# Patient Record
Sex: Male | Born: 1950 | Race: Black or African American | Hispanic: No | Marital: Married | Smoking: Former smoker
Health system: Southern US, Community
[De-identification: ages and names within clinical notes are randomized; demographics above are authoritative.]

## PROBLEM LIST (undated history)

## (undated) DIAGNOSIS — G5603 Carpal tunnel syndrome, bilateral upper limbs: Secondary | ICD-10-CM

## (undated) DIAGNOSIS — M1990 Unspecified osteoarthritis, unspecified site: Secondary | ICD-10-CM

## (undated) DIAGNOSIS — I10 Essential (primary) hypertension: Secondary | ICD-10-CM

## (undated) HISTORY — PX: CARPAL TUNNEL RELEASE: SHX101

## (undated) HISTORY — PX: COLONOSCOPY: SHX174

## (undated) HISTORY — PX: EYE SURGERY: SHX253

---

## 2003-07-31 ENCOUNTER — Ambulatory Visit (HOSPITAL_COMMUNITY): Admission: RE | Admit: 2003-07-31 | Discharge: 2003-07-31 | Payer: Self-pay | Admitting: *Deleted

## 2006-10-02 ENCOUNTER — Ambulatory Visit (HOSPITAL_BASED_OUTPATIENT_CLINIC_OR_DEPARTMENT_OTHER): Admission: RE | Admit: 2006-10-02 | Discharge: 2006-10-02 | Payer: Self-pay | Admitting: Orthopedic Surgery

## 2007-05-11 ENCOUNTER — Ambulatory Visit: Payer: Self-pay | Admitting: Internal Medicine

## 2007-05-11 DIAGNOSIS — F17200 Nicotine dependence, unspecified, uncomplicated: Secondary | ICD-10-CM

## 2007-05-13 ENCOUNTER — Encounter (INDEPENDENT_AMBULATORY_CARE_PROVIDER_SITE_OTHER): Payer: Self-pay | Admitting: *Deleted

## 2007-05-14 ENCOUNTER — Telehealth (INDEPENDENT_AMBULATORY_CARE_PROVIDER_SITE_OTHER): Payer: Self-pay | Admitting: *Deleted

## 2009-07-06 ENCOUNTER — Ambulatory Visit (HOSPITAL_BASED_OUTPATIENT_CLINIC_OR_DEPARTMENT_OTHER): Admission: RE | Admit: 2009-07-06 | Discharge: 2009-07-06 | Payer: Self-pay | Admitting: Orthopedic Surgery

## 2010-09-24 ENCOUNTER — Inpatient Hospital Stay (INDEPENDENT_AMBULATORY_CARE_PROVIDER_SITE_OTHER)
Admission: RE | Admit: 2010-09-24 | Discharge: 2010-09-24 | Disposition: A | Discharge status: Home / Self Care | Payer: BC Managed Care – PPO | Source: Ambulatory Visit | Attending: Family Medicine | Admitting: Family Medicine

## 2010-09-24 ENCOUNTER — Emergency Department (HOSPITAL_COMMUNITY)
Admission: EM | Admit: 2010-09-24 | Discharge: 2010-09-25 | Disposition: A | Discharge status: Home / Self Care | Payer: BC Managed Care – PPO | Attending: Emergency Medicine | Admitting: Emergency Medicine

## 2010-09-24 ENCOUNTER — Emergency Department (HOSPITAL_COMMUNITY): Payer: BC Managed Care – PPO

## 2010-09-24 DIAGNOSIS — R079 Chest pain, unspecified: Secondary | ICD-10-CM

## 2010-09-24 DIAGNOSIS — R0789 Other chest pain: Secondary | ICD-10-CM | POA: Insufficient documentation

## 2010-09-24 DIAGNOSIS — M940 Chondrocostal junction syndrome [Tietze]: Secondary | ICD-10-CM | POA: Insufficient documentation

## 2010-09-24 LAB — DIFFERENTIAL
Basophils Absolute: 0 10*3/uL (ref 0.0–0.1)
Basophils Relative: 1 % (ref 0–1)
Eosinophils Absolute: 0.3 10*3/uL (ref 0.0–0.7)
Eosinophils Relative: 6 % — ABNORMAL HIGH (ref 0–5)
Lymphocytes Relative: 36 % (ref 12–46)
Lymphs Abs: 1.9 10*3/uL (ref 0.7–4.0)
Monocytes Absolute: 0.7 10*3/uL (ref 0.1–1.0)
Monocytes Relative: 13 % — ABNORMAL HIGH (ref 3–12)
Neutro Abs: 2.3 10*3/uL (ref 1.7–7.7)
Neutrophils Relative %: 44 % (ref 43–77)

## 2010-09-24 LAB — CBC
HCT: 42.5 % (ref 39.0–52.0)
Hemoglobin: 14.3 g/dL (ref 13.0–17.0)
MCH: 30.5 pg (ref 26.0–34.0)
MCHC: 33.6 g/dL (ref 30.0–36.0)
MCV: 90.6 fL (ref 78.0–100.0)
Platelets: 189 10*3/uL (ref 150–400)
RBC: 4.69 MIL/uL (ref 4.22–5.81)
RDW: 14 % (ref 11.5–15.5)
WBC: 5.3 10*3/uL (ref 4.0–10.5)

## 2010-09-24 LAB — BASIC METABOLIC PANEL
Calcium: 9.3 mg/dL (ref 8.4–10.5)
GFR calc Af Amer: >60 mL/min (ref >60)
GFR calc non Af Amer: >60 mL/min (ref >60)
Potassium: 4.9 mEq/L (ref 3.5–5.1)
Sodium: 139 mEq/L (ref 135–145)

## 2010-09-24 LAB — POCT CARDIAC MARKERS
CKMB, poc: <1 ng/mL — ABNORMAL LOW (ref 1.0–8.0)
Myoglobin, poc: 41.2 ng/mL (ref 12–200)
Troponin i, poc: <0.05 ng/mL (ref 0.00–0.09)

## 2011-01-03 NOTE — Op Note (Signed)
NAME:  Eric Massey, Eric Massey NO.:  [id number]   MEDICAL RECORD NO.:  [id number]                   PATIENT TYPE:  AMB   LOCATION:  ENDO                                 FACILITY:  [facility]   PHYSICIAN:  Georgiana Spinner, M.D.                 DATE OF BIRTH:  1951/02/28   DATE OF PROCEDURE:  07/31/2003  DATE OF DISCHARGE:                                 OPERATIVE REPORT   PROCEDURE:  Colonoscopy.   ENDOSCOPIST:  Georgiana Spinner, M.D.   INDICATIONS FOR PROCEDURE:  Rectal bleeding.   ANESTHESIA:  Demerol 50 mg, Versed 8 mg.   DESCRIPTION OF PROCEDURE:  With the patient mildly sedated in the left  lateral decubitus position, a rectal examination was performed, which  revealed hemorrhoids only.  From this point the colonoscope was inserted  into the rectum and passed under direct vision to the cecum, identified by  the ileocecal valve and the appendiceal orifice.  From this point the  colonoscope was slowly withdrawn, taking circumferential views of the  colonic mucosa, stopping only to photograph the rectum in direct and in  retroflexed view.  The endoscope was straightened and withdrawn.  The  patient's vital signs and pulse oximeter remained stable.  The patient tolerated the procedure well without any apparent complications.   FINDINGS:  Internal hemorrhoids, otherwise unremarkable endoscopic  examination.   PLAN:  Have the patient follow up with me as needed.                                               Georgiana Spinner, M.D.    GMO/MEDQ  D:  07/31/2003  T:  07/31/2003  Job:  [id number]

## 2011-01-03 NOTE — Op Note (Signed)
NAME:  Eric Massey, Eric Massey NO.:  [id number]   MEDICAL RECORD NO.:  [id number]          PATIENT TYPE:  AMB   LOCATION:  DSC                          FACILITY:  [facility]   PHYSICIAN:  Katy Fitch. Sypher, M.D. DATE OF BIRTH:  1950/12/24   DATE OF PROCEDURE:  DATE OF DISCHARGE:                               OPERATIVE REPORT   PREOPERATIVE DIAGNOSIS:  Right carpal tunnel syndrome.   POSTOPERATIVE DIAGNOSIS:  Right carpal tunnel syndrome.   OPERATIONS:  Release of right transverse carpal ligament.   OPERATING SURGEON:  Katy Fitch. Sypher, MD   ASSISTANT:  Annye Rusk PA-C.   ANESTHESIA:  Is general by LMA, supervising anesthesiologist is Dr.  Gypsy Balsam.   INDICATIONS:  [name] is a 60 year old gentleman referred  through the courtesy of Dr. Dimas Alexandria for evaluation and  management of bilateral hand numbness.  Clinical examination in Dr.  Pincus Sanes office suggested carpal tunnel syndrome.  Electrodiagnostic  studies accomplished at [facility] confirmed severe  right carpal tunnel syndrome and left carpal tunnel syndrome.   Due to a failure to respond to nonoperative measures, [name] is  brought to the operating room at this time for release of his right  transverse carpal ligament.   PROCEDURE:  [name] is brought to the operating room and placed  in supine position on the operating table.   Following induction of general anesthesia by LMA technique, the right  arm was prepped with Betadine soap and solution and sterilely draped.  A  pneumatic tourniquet was applied to the proximal right brachium.   Following exsanguination of the right arm with an Esmarch bandage, the  arterial tourniquet was inflated to 250 mmHg.  The procedure commenced  with a short incision in the line of the ring finger in the palm.  The  subcutaneous tissues were carefully divided to reveal the palmar fascia.  This was split longitudinally to reveal  the common sensory branch of the  median nerve and superficial palmar arch.   The transverse carpal ligament was isolated by passage of a Penfield #4  elevator superficial to the tendons and nerve.  A pair of scissors was  used to release the transverse carpal ligament along its ulnar border  extending into the distal forearm.   This widely opened the carpal canal.  No masses or other predicaments  were noted.   Bleeding points along the margin of the released ligament were  electrocauterized with bipolar current.   For aftercare [name] is provided a prescription for Percocet 5 mg  one or two tablets p.o. q.4-6h. p.r.n. pain, 20 tablets without refill.   He will return to see me the office for follow-up in 1 week.   Synetta Fail Sypher, M.D.  Electronically Signed     RVS/MEDQ  D:  10/02/2006  T:  10/02/2006  Job:  [id number]

## 2015-07-24 ENCOUNTER — Other Ambulatory Visit: Payer: Self-pay | Admitting: Urology

## 2015-07-24 DIAGNOSIS — R972 Elevated prostate specific antigen [PSA]: Secondary | ICD-10-CM

## 2015-08-09 ENCOUNTER — Ambulatory Visit (HOSPITAL_COMMUNITY)
Admission: RE | Admit: 2015-08-09 | Discharge: 2015-08-09 | Disposition: A | Discharge status: Home / Self Care | Payer: BLUE CROSS/BLUE SHIELD | Source: Ambulatory Visit | Attending: Urology | Admitting: Urology

## 2015-08-09 DIAGNOSIS — R972 Elevated prostate specific antigen [PSA]: Secondary | ICD-10-CM | POA: Insufficient documentation

## 2015-08-09 LAB — POCT I-STAT CREATININE: CREATININE: 0.9 mg/dL (ref 0.61–1.24)

## 2015-08-09 MED ORDER — GADOBENATE DIMEGLUMINE 529 MG/ML IV SOLN
20.0000 mL | Freq: Once | INTRAVENOUS | Status: AC | PRN
  Administered 2015-08-09: 17 mL via INTRAVENOUS

## 2016-09-08 DIAGNOSIS — G5602 Carpal tunnel syndrome, left upper limb: Secondary | ICD-10-CM | POA: Diagnosis not present

## 2016-10-17 DIAGNOSIS — Z125 Encounter for screening for malignant neoplasm of prostate: Secondary | ICD-10-CM | POA: Diagnosis not present

## 2016-10-17 DIAGNOSIS — I10 Essential (primary) hypertension: Secondary | ICD-10-CM | POA: Diagnosis not present

## 2016-10-20 DIAGNOSIS — K640 First degree hemorrhoids: Secondary | ICD-10-CM | POA: Diagnosis not present

## 2016-10-20 DIAGNOSIS — I10 Essential (primary) hypertension: Secondary | ICD-10-CM | POA: Diagnosis not present

## 2016-10-20 DIAGNOSIS — Z86010 Personal history of colonic polyps: Secondary | ICD-10-CM | POA: Diagnosis not present

## 2016-10-23 DIAGNOSIS — R972 Elevated prostate specific antigen [PSA]: Secondary | ICD-10-CM | POA: Diagnosis not present

## 2016-10-30 DIAGNOSIS — R972 Elevated prostate specific antigen [PSA]: Secondary | ICD-10-CM | POA: Diagnosis not present

## 2016-11-04 DIAGNOSIS — K6389 Other specified diseases of intestine: Secondary | ICD-10-CM | POA: Diagnosis not present

## 2016-11-04 DIAGNOSIS — Z1211 Encounter for screening for malignant neoplasm of colon: Secondary | ICD-10-CM | POA: Diagnosis not present

## 2017-01-06 DIAGNOSIS — G5602 Carpal tunnel syndrome, left upper limb: Secondary | ICD-10-CM | POA: Diagnosis not present

## 2017-04-27 DIAGNOSIS — R972 Elevated prostate specific antigen [PSA]: Secondary | ICD-10-CM | POA: Diagnosis not present

## 2017-05-04 DIAGNOSIS — N400 Enlarged prostate without lower urinary tract symptoms: Secondary | ICD-10-CM | POA: Diagnosis not present

## 2017-05-04 DIAGNOSIS — R972 Elevated prostate specific antigen [PSA]: Secondary | ICD-10-CM | POA: Diagnosis not present

## 2017-07-25 IMAGING — MR MR PROSTATE WO/W CM
23 of 53 series · 23 of 53 positions shown · IV contrast (multihance)
Comparison: None.

CLINICAL DATA: Elevated PSA. Negative prostate biopsy in July 2014.

EXAM:
MR PROSTATE WITHOUT AND WITH CONTRAST
TECHNIQUE: Multiplanar multisequence MRI images were obtained of the pelvis
centered about the prostate. Pre and post contrast images were
obtained.
CONTRAST:  17mL MULTIHANCE GADOBENATE DIMEGLUMINE 529 MG/ML IV SOLN

[Series 3: bSSFP fat-sat · axial · 6.0mm · 0.86mm/px · 1 of 44 slices shown]
[im 1/44]
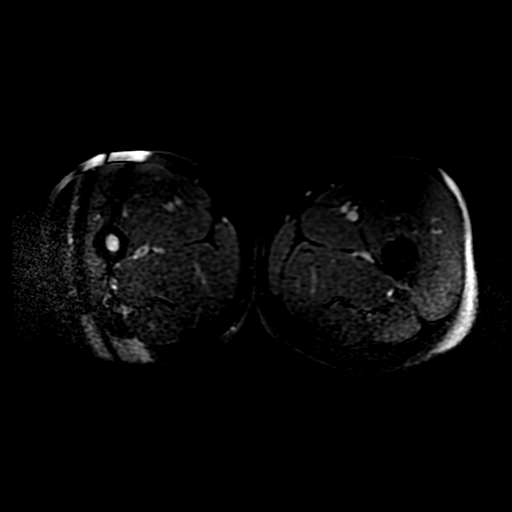

[Series 4: T1 · axial · 6.0mm · 0.86mm/px · 1 of 44 slices shown (1 of 2)]
[im 1/44]
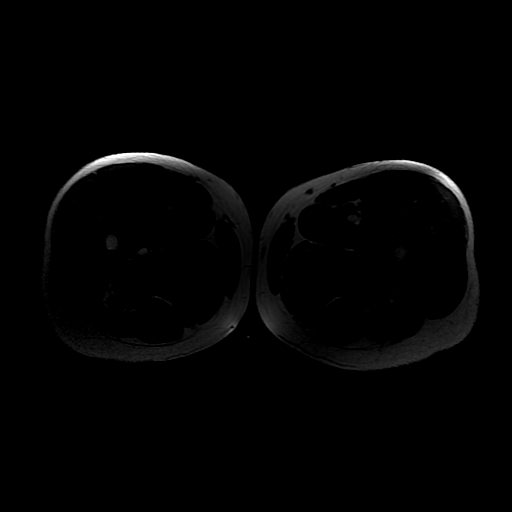

[Series 5: T2 · axial · 3.0mm · 0.29mm/px · 1 of 28 slices shown (1 of 3)]
[im 1/28]
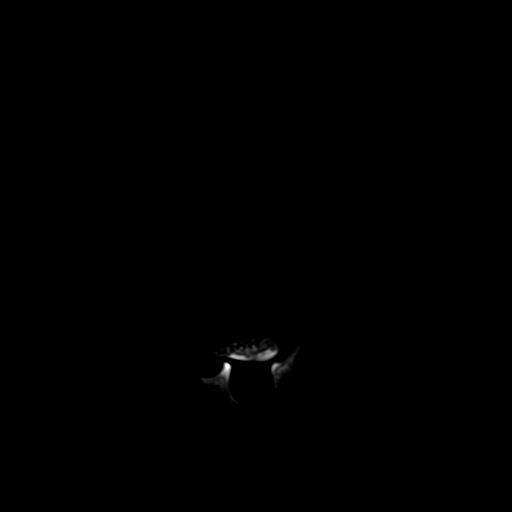

[Series 6: T1 · axial · 3.0mm · 0.29mm/px · 1 of 28 slices shown (2 of 2)]
[im 1/28]
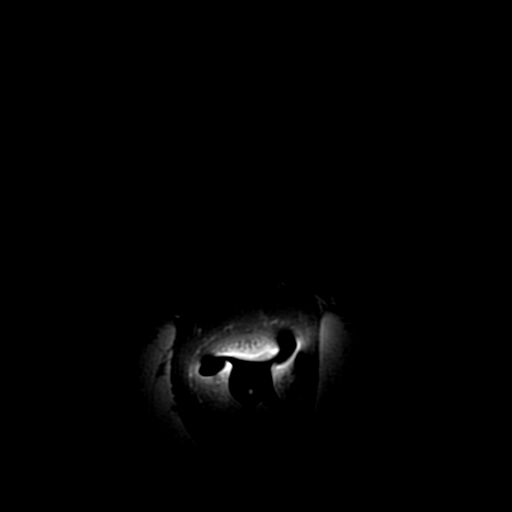

[Series 7: T2 · sagittal · 4.0mm · 0.29mm/px · 1 of 20 slices shown (2 of 3)]
[im 1/20]
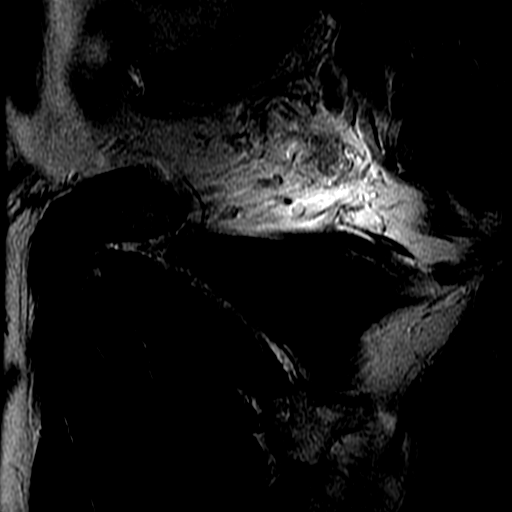

[Series 8: T2 · coronal · 4.0mm · 0.29mm/px · 1 of 20 slices shown (3 of 3)]
[im 1/20]
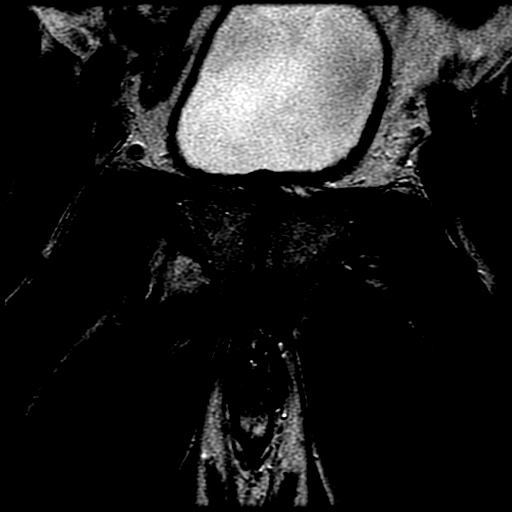

[Series 9: DWI · axial · 3.0mm · 0.59mm/px · 1 of 48 slices shown (1 of 6)]
[im 1/48]
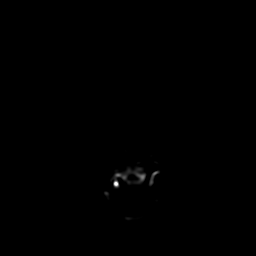

[Series 10: DWI · axial · 3.0mm · 0.59mm/px · 1 of 48 slices shown (2 of 6)]
[im 1/48]
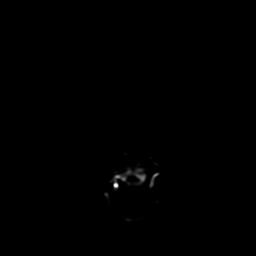

[Series 11: DWI · axial · 3.0mm · 0.59mm/px · 1 of 48 slices shown (3 of 6)]
[im 1/48]
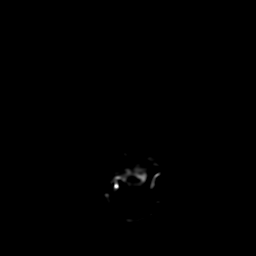

[Series 900: DWI · axial · 3.0mm · 0.59mm/px · 1 of 24 slices shown (4 of 6)]
[im 1/24]
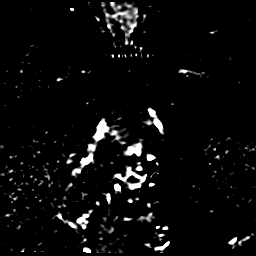

[Series 1000: DWI · axial · 3.0mm · 0.59mm/px · 1 of 24 slices shown (5 of 6)]
[im 1/24]
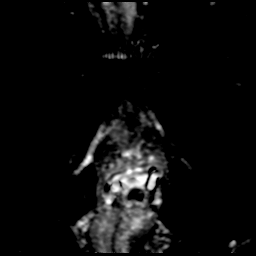

[Series 1100: DWI · axial · 3.0mm · 0.59mm/px · 1 of 24 slices shown (6 of 6)]
[im 1/24]
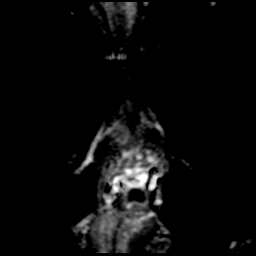

[((id)/(id)/1)-((id)/(id)/1) · axial · 3.0mm · 0.43mm/px · 1 of 71 slices shown (1 of 11)]
[im 1/71]
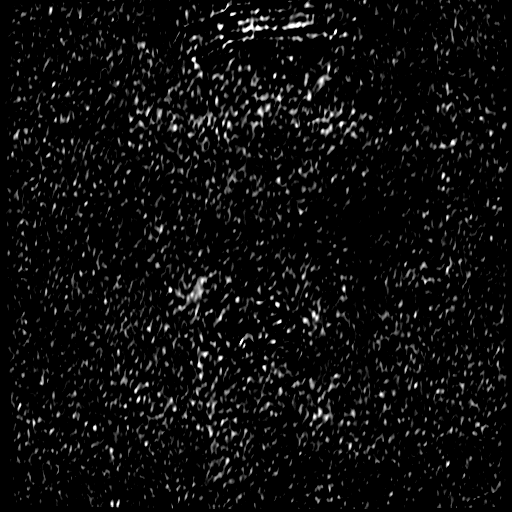

[((id)/(id)/1)-((id)/(id)/1) · axial · 3.0mm · 0.43mm/px · 1 of 72 slices shown (2 of 11)]
[im 1/72]
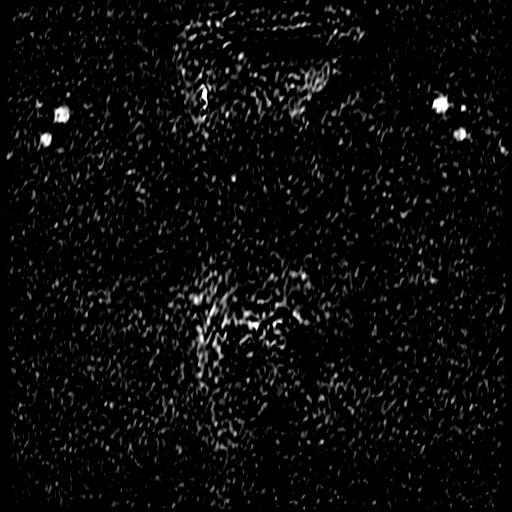

[((id)/(id)/1)-((id)/(id)/1) · axial · 3.0mm · 0.43mm/px · 1 of 72 slices shown (3 of 11)]
[im 1/72]
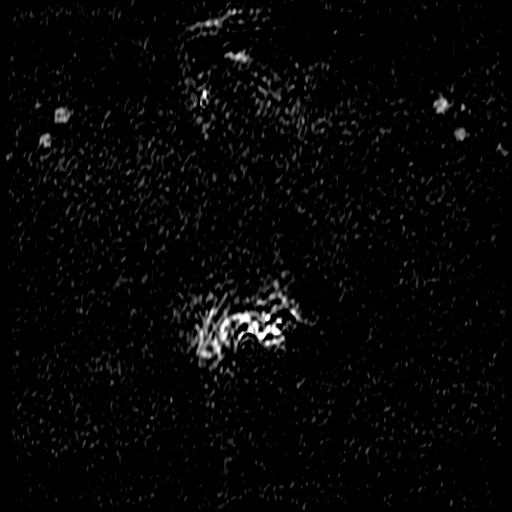

[((id)/(id)/1)-((id)/(id)/1) · axial · 3.0mm · 0.43mm/px · 1 of 72 slices shown (4 of 11)]
[im 1/72]
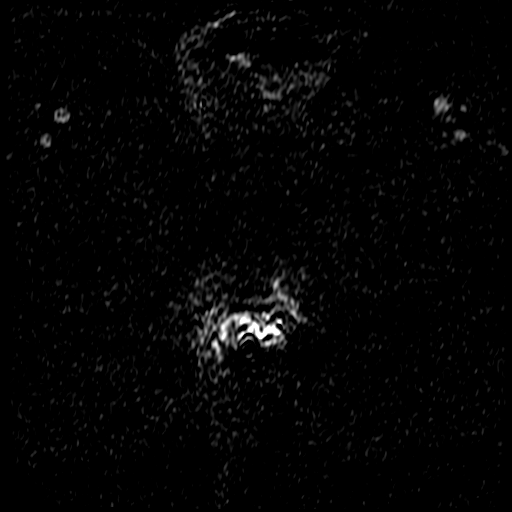

[((id)/(id)/1)-((id)/(id)/1) · axial · 3.0mm · 0.43mm/px · 1 of 72 slices shown (5 of 11)]
[im 1/72]
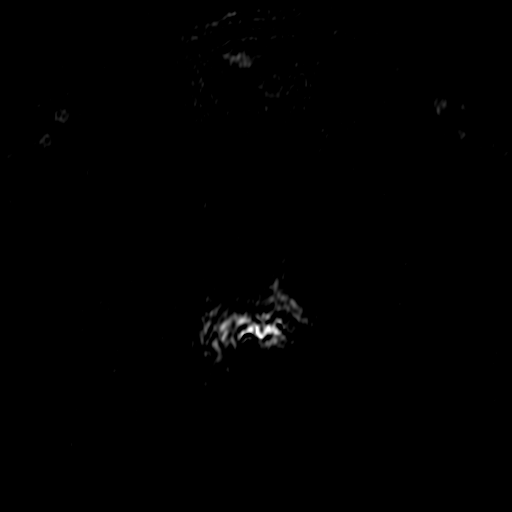

[((id)/(id)/1)-((id)/(id)/1) · axial · 3.0mm · 0.43mm/px · 1 of 72 slices shown (6 of 11)]
[im 1/72]
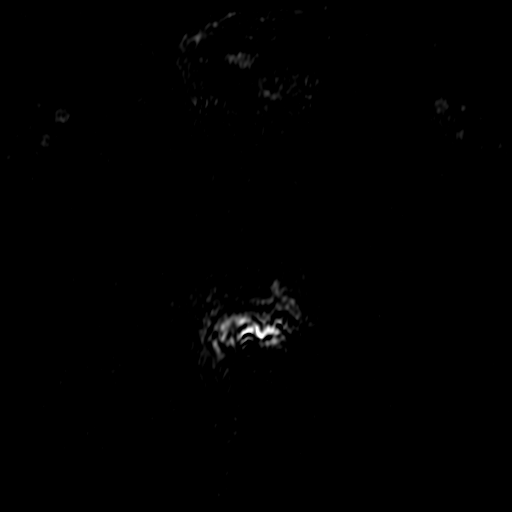

[((id)/(id)/1)-((id)/(id)/1) · axial · 3.0mm · 0.43mm/px · 1 of 72 slices shown (7 of 11)]
[im 1/72]
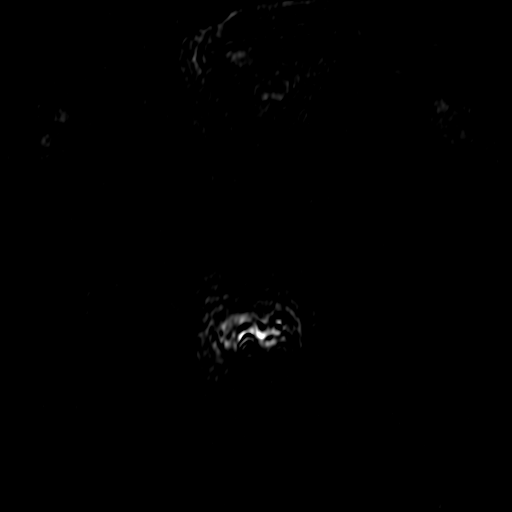

[((id)/(id)/1)-((id)/(id)/1) · axial · 3.0mm · 0.43mm/px · 1 of 72 slices shown (8 of 11)]
[im 1/72]
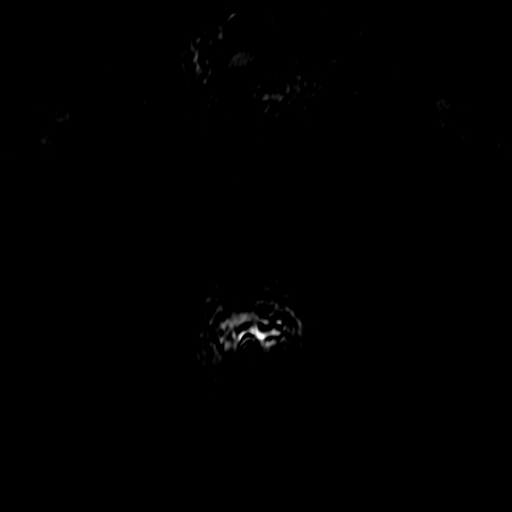

[((id)/(id)/1)-((id)/(id)/1) · axial · 3.0mm · 0.43mm/px · 1 of 72 slices shown (9 of 11)]
[im 1/72]
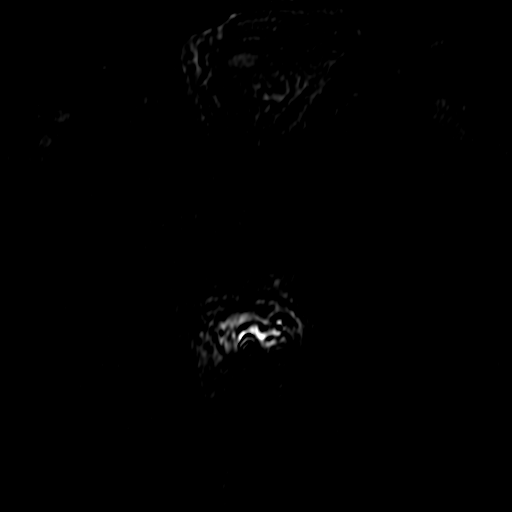

[((id)/(id)/1)-((id)/(id)/1) · axial · 3.0mm · 0.43mm/px · 1 of 72 slices shown (10 of 11)]
[im 1/72]
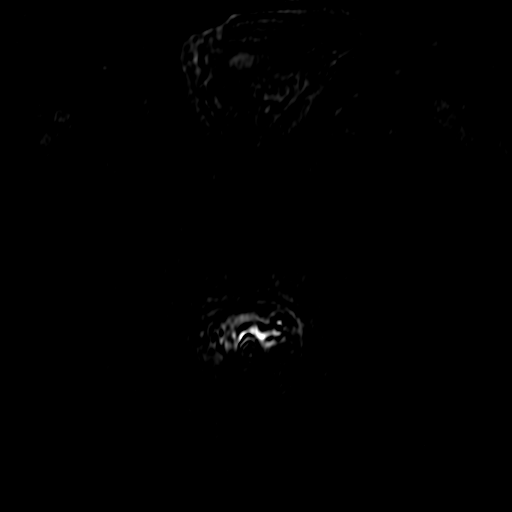

[((id)/(id)/1)-((id)/(id)/1) · axial · 3.0mm · 0.43mm/px · 1 of 72 slices shown (11 of 11)]
[im 1/72]
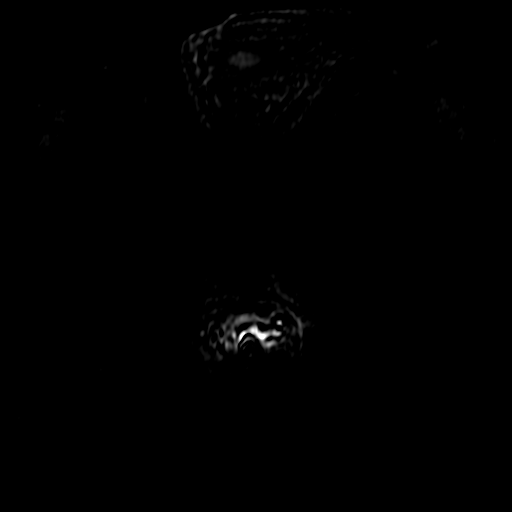

[23 of 53 positions shown; findings below may reference images not displayed]

FINDINGS: Prostate: Thinning of the peripheral gland.

5 x 4 mm lesion in the lateral left apex of the peripheral zone
(series 5/image 19). Associated low ADC without true restricted
diffusion (series 9955/image 8). Associated early arterial
enhancement (series 7516/image 38). This appearance is indeterminate
but suggestive of low-grade macroscopic prostate cancer.

Enlargement/nodularity of the central gland, which indents the base
of the bladder. No suspicious central gland nodularity on T2.

Transcapsular spread:  Absent.

Seminal vesicle involvement: Absent.

Neurovascular bundle involvement: Absent.

Pelvic adenopathy: Absent.

Bone metastasis: Absent.

Other findings: None.
IMPRESSION: 5 x 4 mm lesion in the lateral left apex of the peripheral zone,
indeterminate but suggestive of low grade macroscopic prostate
cancer. If repeat biopsy is attempted, targeting of this region is
suggested.

No findings specific for high-grade macroscopic prostate cancer.

Enlargement/nodularity of the central gland, suggesting BPH.

## 2017-10-26 DIAGNOSIS — Z125 Encounter for screening for malignant neoplasm of prostate: Secondary | ICD-10-CM | POA: Diagnosis not present

## 2017-10-26 DIAGNOSIS — R972 Elevated prostate specific antigen [PSA]: Secondary | ICD-10-CM | POA: Diagnosis not present

## 2017-10-26 DIAGNOSIS — I10 Essential (primary) hypertension: Secondary | ICD-10-CM | POA: Diagnosis not present

## 2017-10-26 DIAGNOSIS — Z0001 Encounter for general adult medical examination with abnormal findings: Secondary | ICD-10-CM | POA: Diagnosis not present

## 2017-12-30 DIAGNOSIS — H2513 Age-related nuclear cataract, bilateral: Secondary | ICD-10-CM | POA: Diagnosis not present

## 2017-12-30 DIAGNOSIS — H02839 Dermatochalasis of unspecified eye, unspecified eyelid: Secondary | ICD-10-CM | POA: Diagnosis not present

## 2017-12-30 DIAGNOSIS — H2511 Age-related nuclear cataract, right eye: Secondary | ICD-10-CM | POA: Diagnosis not present

## 2018-02-08 DIAGNOSIS — H2511 Age-related nuclear cataract, right eye: Secondary | ICD-10-CM | POA: Diagnosis not present

## 2018-05-03 DIAGNOSIS — R972 Elevated prostate specific antigen [PSA]: Secondary | ICD-10-CM | POA: Diagnosis not present

## 2018-05-07 DIAGNOSIS — R972 Elevated prostate specific antigen [PSA]: Secondary | ICD-10-CM | POA: Diagnosis not present

## 2018-06-07 ENCOUNTER — Other Ambulatory Visit: Payer: Self-pay | Admitting: Orthopedic Surgery

## 2018-06-07 DIAGNOSIS — G5602 Carpal tunnel syndrome, left upper limb: Secondary | ICD-10-CM | POA: Diagnosis not present

## 2018-06-21 ENCOUNTER — Ambulatory Visit (INDEPENDENT_AMBULATORY_CARE_PROVIDER_SITE_OTHER): Payer: BLUE CROSS/BLUE SHIELD

## 2018-06-21 ENCOUNTER — Ambulatory Visit (INDEPENDENT_AMBULATORY_CARE_PROVIDER_SITE_OTHER): Payer: BLUE CROSS/BLUE SHIELD | Admitting: Podiatry

## 2018-06-21 ENCOUNTER — Encounter: Payer: Self-pay | Admitting: Podiatry

## 2018-06-21 VITALS — BP 146/85 | HR 66

## 2018-06-21 DIAGNOSIS — G629 Polyneuropathy, unspecified: Secondary | ICD-10-CM

## 2018-06-21 DIAGNOSIS — M779 Enthesopathy, unspecified: Secondary | ICD-10-CM

## 2018-06-21 DIAGNOSIS — M778 Other enthesopathies, not elsewhere classified: Secondary | ICD-10-CM

## 2018-06-21 NOTE — Progress Notes (Signed)
Orthotic note: Dress subortholene w/ 79m p-cell over 1.5 poron...small met pads b/l..Marland Kitchenichie to fab. 245mdeep heel cup.

## 2018-06-22 NOTE — Progress Notes (Signed)
Subjective:   Patient ID: Eric Massey, male   DOB: 67 y.o.   MRN: [id number]   HPI Patient presents today and has been working on concrete floors for years and is developing numbness in his feet and occasional discomfort if he does too much.  Patient states that he is tried different insoles that seem to give him temporary relief but they seem to recur quite frequently and the pain is quite intense if he is on the foot for too long of a time.  Patient is concerned about the numbness   Review of Systems  All other systems reviewed and are negative.       Objective:  Physical Exam  Constitutional: He appears well-developed and well-nourished.  Cardiovascular: Intact distal pulses.  Pulmonary/Chest: Effort normal.  Musculoskeletal: Normal range of motion.  Neurological: He is alert.  Skin: Skin is warm.  Nursing note and vitals reviewed.   Neurovascular status intact muscle strength was found to be within normal limits with patient found to have on the left mild diminishment sharp dull vibratory.  The right did not appear to have this deficit and there is more symptoms on the left than the right.  Patient was found to have good digital perfusion well oriented x3     Assessment:  Possibility for low-grade capsulitis with inflammation occurring secondary to long-standing nature of walking on cement type floors     Plan:  H&P x-rays reviewed today I went ahead and I recommended orthotics to try to disperse the weight off the plantar arches metatarsals and heels.  Patient is casted by PET orthotist and will be seen back when ready and was advised on other medication he could try for neuropathy  X-ray indicates that there is no indications of arthritis or advanced pathology or stress fracture bilateral

## 2018-07-12 ENCOUNTER — Ambulatory Visit: Payer: BLUE CROSS/BLUE SHIELD | Admitting: Orthotics

## 2018-07-12 DIAGNOSIS — M779 Enthesopathy, unspecified: Principal | ICD-10-CM

## 2018-07-12 DIAGNOSIS — M778 Other enthesopathies, not elsewhere classified: Secondary | ICD-10-CM

## 2018-07-23 ENCOUNTER — Ambulatory Visit: Payer: BLUE CROSS/BLUE SHIELD | Admitting: Orthotics

## 2018-07-23 DIAGNOSIS — M778 Other enthesopathies, not elsewhere classified: Secondary | ICD-10-CM

## 2018-07-23 DIAGNOSIS — M779 Enthesopathy, unspecified: Principal | ICD-10-CM

## 2018-07-23 NOTE — Progress Notes (Signed)
Met pad uncomfortable, will remove.

## 2018-07-25 NOTE — Progress Notes (Signed)
Patient came in today to pick up custom made foot orthotics.  The goals were accomplished and the patient reported no dissatisfaction with said orthotics.  Patient was advised of breakin period and how to report any issues. 

## 2018-07-27 DIAGNOSIS — M62838 Other muscle spasm: Secondary | ICD-10-CM | POA: Diagnosis not present

## 2018-07-27 DIAGNOSIS — M545 Low back pain: Secondary | ICD-10-CM | POA: Diagnosis not present

## 2018-07-27 DIAGNOSIS — I10 Essential (primary) hypertension: Secondary | ICD-10-CM | POA: Diagnosis not present

## 2018-07-28 DIAGNOSIS — G5602 Carpal tunnel syndrome, left upper limb: Secondary | ICD-10-CM | POA: Diagnosis not present

## 2018-08-17 ENCOUNTER — Other Ambulatory Visit: Payer: Self-pay

## 2018-08-17 ENCOUNTER — Encounter (HOSPITAL_BASED_OUTPATIENT_CLINIC_OR_DEPARTMENT_OTHER): Payer: Self-pay | Admitting: *Deleted

## 2018-08-19 ENCOUNTER — Encounter (HOSPITAL_BASED_OUTPATIENT_CLINIC_OR_DEPARTMENT_OTHER)
Admission: RE | Admit: 2018-08-19 | Discharge: 2018-08-19 | Disposition: A | Discharge status: Home / Self Care | Payer: BLUE CROSS/BLUE SHIELD | Source: Ambulatory Visit | Attending: Orthopedic Surgery | Admitting: Orthopedic Surgery

## 2018-08-19 DIAGNOSIS — I10 Essential (primary) hypertension: Secondary | ICD-10-CM | POA: Insufficient documentation

## 2018-08-19 DIAGNOSIS — Z01818 Encounter for other preprocedural examination: Secondary | ICD-10-CM | POA: Insufficient documentation

## 2018-08-19 DIAGNOSIS — R9431 Abnormal electrocardiogram [ECG] [EKG]: Secondary | ICD-10-CM | POA: Insufficient documentation

## 2018-08-19 LAB — BASIC METABOLIC PANEL
Anion gap: 8 (ref 5–15)
BUN: 14 mg/dL (ref 8–23)
CO2: 27 mmol/L (ref 22–32)
Calcium: 9.4 mg/dL (ref 8.9–10.3)
Chloride: 103 mmol/L (ref 98–111)
Creatinine, Ser: 0.86 mg/dL (ref 0.61–1.24)
GFR calc Af Amer: >60 mL/min (ref >60)
GFR calc non Af Amer: >60 mL/min (ref >60)
Glucose, Bld: 110 mg/dL — ABNORMAL HIGH (ref 70–99)
Potassium: 4.4 mmol/L (ref 3.5–5.1)
Sodium: 138 mmol/L (ref 135–145)

## 2018-08-23 ENCOUNTER — Ambulatory Visit: Payer: BLUE CROSS/BLUE SHIELD | Admitting: Orthotics

## 2018-08-23 DIAGNOSIS — M778 Other enthesopathies, not elsewhere classified: Secondary | ICD-10-CM

## 2018-08-23 DIAGNOSIS — G629 Polyneuropathy, unspecified: Secondary | ICD-10-CM

## 2018-08-23 DIAGNOSIS — M779 Enthesopathy, unspecified: Principal | ICD-10-CM

## 2018-08-23 NOTE — Progress Notes (Signed)
Patient came in today to pick up custom made foot orthotics.  The goals were accomplished and the patient reported no dissatisfaction with said orthotics.  Patient was advised of breakin period and how to report any issues. 

## 2018-08-26 ENCOUNTER — Ambulatory Visit (HOSPITAL_BASED_OUTPATIENT_CLINIC_OR_DEPARTMENT_OTHER): Payer: BLUE CROSS/BLUE SHIELD | Admitting: Anesthesiology

## 2018-08-26 ENCOUNTER — Ambulatory Visit (HOSPITAL_BASED_OUTPATIENT_CLINIC_OR_DEPARTMENT_OTHER)
Admission: RE | Admit: 2018-08-26 | Discharge: 2018-08-26 | Disposition: A | Discharge status: Home / Self Care | Payer: BLUE CROSS/BLUE SHIELD | Attending: Orthopedic Surgery | Admitting: Orthopedic Surgery

## 2018-08-26 ENCOUNTER — Encounter (HOSPITAL_BASED_OUTPATIENT_CLINIC_OR_DEPARTMENT_OTHER): Payer: Self-pay

## 2018-08-26 ENCOUNTER — Other Ambulatory Visit: Payer: Self-pay

## 2018-08-26 ENCOUNTER — Encounter (HOSPITAL_BASED_OUTPATIENT_CLINIC_OR_DEPARTMENT_OTHER)
Admission: RE | Disposition: A | Discharge status: Home / Self Care | Payer: Self-pay | Source: Home / Self Care | Attending: Orthopedic Surgery

## 2018-08-26 DIAGNOSIS — M19042 Primary osteoarthritis, left hand: Secondary | ICD-10-CM | POA: Diagnosis not present

## 2018-08-26 DIAGNOSIS — I10 Essential (primary) hypertension: Secondary | ICD-10-CM | POA: Diagnosis not present

## 2018-08-26 DIAGNOSIS — Z7982 Long term (current) use of aspirin: Secondary | ICD-10-CM | POA: Diagnosis not present

## 2018-08-26 DIAGNOSIS — M19041 Primary osteoarthritis, right hand: Secondary | ICD-10-CM | POA: Insufficient documentation

## 2018-08-26 DIAGNOSIS — Z87891 Personal history of nicotine dependence: Secondary | ICD-10-CM | POA: Insufficient documentation

## 2018-08-26 DIAGNOSIS — Z79899 Other long term (current) drug therapy: Secondary | ICD-10-CM | POA: Diagnosis not present

## 2018-08-26 DIAGNOSIS — G5602 Carpal tunnel syndrome, left upper limb: Secondary | ICD-10-CM | POA: Insufficient documentation

## 2018-08-26 HISTORY — DX: Carpal tunnel syndrome, bilateral upper limbs: G56.03

## 2018-08-26 HISTORY — DX: Unspecified osteoarthritis, unspecified site: M19.90

## 2018-08-26 HISTORY — DX: Essential (primary) hypertension: I10

## 2018-08-26 HISTORY — PX: CARPAL TUNNEL RELEASE: SHX101

## 2018-08-26 SURGERY — CARPAL TUNNEL RELEASE
Anesthesia: Regional | Site: Wrist | Laterality: Left

## 2018-08-26 MED ORDER — HYDROCODONE-ACETAMINOPHEN 5-325 MG PO TABS: ORAL_TABLET | ORAL | 0 refills | Status: AC

## 2018-08-26 MED ORDER — FENTANYL CITRATE (PF) 100 MCG/2ML IJ SOLN: 25.0000 ug | INTRAMUSCULAR | Status: DC | PRN

## 2018-08-26 MED ORDER — LIDOCAINE 2% (20 MG/ML) 5 ML SYRINGE
INTRAMUSCULAR | Status: DC | PRN
  Administered 2018-08-26: 40 mg via INTRAVENOUS

## 2018-08-26 MED ORDER — MIDAZOLAM HCL 2 MG/2ML IJ SOLN: 1.0000 mg | INTRAMUSCULAR | Status: DC | PRN

## 2018-08-26 MED ORDER — LIDOCAINE HCL (PF) 0.5 % IJ SOLN
INTRAMUSCULAR | Status: DC | PRN
  Administered 2018-08-26: 35 mL via INTRAVENOUS

## 2018-08-26 MED ORDER — PROPOFOL 500 MG/50ML IV EMUL
INTRAVENOUS | Status: DC | PRN
  Administered 2018-08-26: 75 ug/kg/min via INTRAVENOUS

## 2018-08-26 MED ORDER — CEFAZOLIN SODIUM-DEXTROSE 2-4 GM/100ML-% IV SOLN
INTRAVENOUS | Status: AC
  Filled 2018-08-26: qty 100

## 2018-08-26 MED ORDER — MIDAZOLAM HCL 2 MG/2ML IJ SOLN
INTRAMUSCULAR | Status: AC
  Filled 2018-08-26: qty 2

## 2018-08-26 MED ORDER — ONDANSETRON HCL 4 MG/2ML IJ SOLN
INTRAMUSCULAR | Status: AC
  Filled 2018-08-26: qty 2

## 2018-08-26 MED ORDER — FENTANYL CITRATE (PF) 100 MCG/2ML IJ SOLN: 50.0000 ug | INTRAMUSCULAR | Status: DC | PRN

## 2018-08-26 MED ORDER — PROPOFOL 500 MG/50ML IV EMUL
INTRAVENOUS | Status: AC
  Filled 2018-08-26: qty 50

## 2018-08-26 MED ORDER — FENTANYL CITRATE (PF) 100 MCG/2ML IJ SOLN
INTRAMUSCULAR | Status: DC | PRN
  Administered 2018-08-26 (×2): 50 ug via INTRAVENOUS

## 2018-08-26 MED ORDER — ONDANSETRON HCL 4 MG/2ML IJ SOLN
INTRAMUSCULAR | Status: DC | PRN
  Administered 2018-08-26: 4 mg via INTRAVENOUS

## 2018-08-26 MED ORDER — CHLORHEXIDINE GLUCONATE 4 % EX LIQD: 60.0000 mL | Freq: Once | CUTANEOUS | Status: DC

## 2018-08-26 MED ORDER — LACTATED RINGERS IV SOLN
INTRAVENOUS | Status: DC
  Administered 2018-08-26 (×2): via INTRAVENOUS

## 2018-08-26 MED ORDER — PROMETHAZINE HCL 25 MG/ML IJ SOLN: 6.2500 mg | INTRAMUSCULAR | Status: DC | PRN

## 2018-08-26 MED ORDER — FENTANYL CITRATE (PF) 100 MCG/2ML IJ SOLN
INTRAMUSCULAR | Status: AC
  Filled 2018-08-26: qty 2

## 2018-08-26 MED ORDER — MIDAZOLAM HCL 5 MG/5ML IJ SOLN
INTRAMUSCULAR | Status: DC | PRN
  Administered 2018-08-26: 2 mg via INTRAVENOUS

## 2018-08-26 MED ORDER — BUPIVACAINE HCL (PF) 0.25 % IJ SOLN
INTRAMUSCULAR | Status: DC | PRN
  Administered 2018-08-26: 10 mL

## 2018-08-26 MED ORDER — SCOPOLAMINE 1 MG/3DAYS TD PT72: 1.0000 | MEDICATED_PATCH | Freq: Once | TRANSDERMAL | Status: DC | PRN

## 2018-08-26 MED ORDER — CEFAZOLIN SODIUM-DEXTROSE 2-4 GM/100ML-% IV SOLN
2.0000 g | INTRAVENOUS | Status: AC
  Administered 2018-08-26: 2 g via INTRAVENOUS

## 2018-08-26 SURGICAL SUPPLY — 37 items
BANDAGE ACE 3X5.8 VEL STRL LF (GAUZE/BANDAGES/DRESSINGS) ×2 IMPLANT
BLADE SURG 15 STRL LF DISP TIS (BLADE) ×2 IMPLANT
BLADE SURG 15 STRL SS (BLADE) ×2
BNDG ESMARK 4X9 LF (GAUZE/BANDAGES/DRESSINGS) ×1 IMPLANT
BNDG GAUZE ELAST 4 BULKY (GAUZE/BANDAGES/DRESSINGS) ×2 IMPLANT
CHLORAPREP W/TINT 26ML (MISCELLANEOUS) ×2 IMPLANT
CORD BIPOLAR FORCEPS 12FT (ELECTRODE) ×2 IMPLANT
COVER BACK TABLE 60X90IN (DRAPES) ×2 IMPLANT
COVER MAYO STAND STRL (DRAPES) ×2 IMPLANT
COVER WAND RF STERILE (DRAPES) IMPLANT
CUFF TOURNIQUET SINGLE 18IN (TOURNIQUET CUFF) ×2 IMPLANT
DRAPE EXTREMITY T 121X128X90 (DISPOSABLE) ×2 IMPLANT
DRAPE SURG 17X23 STRL (DRAPES) ×2 IMPLANT
DRSG PAD ABDOMINAL 8X10 ST (GAUZE/BANDAGES/DRESSINGS) ×2 IMPLANT
GAUZE SPONGE 4X4 12PLY STRL (GAUZE/BANDAGES/DRESSINGS) ×2 IMPLANT
GAUZE XEROFORM 1X8 LF (GAUZE/BANDAGES/DRESSINGS) ×2 IMPLANT
GLOVE BIO SURGEON STRL SZ 6.5 (GLOVE) ×1 IMPLANT
GLOVE BIO SURGEON STRL SZ7.5 (GLOVE) ×2 IMPLANT
GLOVE BIOGEL PI IND STRL 7.0 (GLOVE) IMPLANT
GLOVE BIOGEL PI IND STRL 8 (GLOVE) ×1 IMPLANT
GLOVE BIOGEL PI INDICATOR 7.0 (GLOVE) ×2
GLOVE BIOGEL PI INDICATOR 8 (GLOVE) ×1
GOWN STRL REUS W/ TWL LRG LVL3 (GOWN DISPOSABLE) ×1 IMPLANT
GOWN STRL REUS W/TWL LRG LVL3 (GOWN DISPOSABLE) ×1
GOWN STRL REUS W/TWL XL LVL3 (GOWN DISPOSABLE) ×2 IMPLANT
NDL HYPO 25X1 1.5 SAFETY (NEEDLE) ×1 IMPLANT
NEEDLE HYPO 25X1 1.5 SAFETY (NEEDLE) ×2 IMPLANT
NS IRRIG 1000ML POUR BTL (IV SOLUTION) ×2 IMPLANT
PACK BASIN DAY SURGERY FS (CUSTOM PROCEDURE TRAY) ×2 IMPLANT
PADDING CAST ABS 4INX4YD NS (CAST SUPPLIES) ×1
PADDING CAST ABS COTTON 4X4 ST (CAST SUPPLIES) ×1 IMPLANT
STOCKINETTE 4X48 STRL (DRAPES) ×2 IMPLANT
SUT ETHILON 4 0 PS 2 18 (SUTURE) ×2 IMPLANT
SYR BULB 3OZ (MISCELLANEOUS) ×2 IMPLANT
SYR CONTROL 10ML LL (SYRINGE) ×2 IMPLANT
TOWEL GREEN STERILE FF (TOWEL DISPOSABLE) ×4 IMPLANT
UNDERPAD 30X30 (UNDERPADS AND DIAPERS) ×2 IMPLANT

## 2018-08-26 NOTE — Anesthesia Procedure Notes (Signed)
Anesthesia Regional Block: Bier block (IV Regional)   Pre-Anesthetic Checklist: ,, timeout performed, Correct Patient, Correct Site, Correct Laterality, Correct Procedure, Correct Position, site marked, Risks and benefits discussed,  Surgical consent,  Pre-op evaluation,  At surgeon's request and post-op pain management  Laterality: Left  Prep: chloraprep       Needles:       Needle Gauge: 20     Additional Needles:   Procedures:,,,,, intact distal pulses, Esmarch exsanguination, single tourniquet utilized, #20gu IV placed  Narrative:  Start time: 08/26/2018 9:39 AM End time: 08/26/2018 9:41 AM  Performed by: Personally  Anesthesiologist: Heather Roberts, MD CRNA: Pearson Grippe, CRNA

## 2018-08-26 NOTE — Anesthesia Preprocedure Evaluation (Signed)
Anesthesia Evaluation  Patient identified by MRN, date of birth, ID band Patient awake    Reviewed: Allergy & Precautions, NPO status , Patient's Chart, lab work & pertinent test results  Airway Mallampati: II  TM Distance: >3 FB Neck ROM: Full    Dental no notable dental hx. (+) Dental Advisory Given   Pulmonary former smoker,    Pulmonary exam normal        Cardiovascular hypertension, Pt. on medications negative cardio ROS Normal cardiovascular exam     Neuro/Psych negative neurological ROS  negative psych ROS   GI/Hepatic negative GI ROS, Neg liver ROS,   Endo/Other  negative endocrine ROS  Renal/GU negative Renal ROS  negative genitourinary   Musculoskeletal negative musculoskeletal ROS (+)   Abdominal   Peds negative pediatric ROS (+)  Hematology negative hematology ROS (+)   Anesthesia Other Findings   Reproductive/Obstetrics negative OB ROS                             Anesthesia Physical Anesthesia Plan  ASA: II  Anesthesia Plan: Bier Block and Bier Block-LIDOCAINE ONLY   Post-op Pain Management:    Induction:   PONV Risk Score and Plan: 1 and Ondansetron and Propofol infusion  Airway Management Planned: Natural Airway and Simple Face Mask  Additional Equipment:   Intra-op Plan:   Post-operative Plan:   Informed Consent: I have reviewed the patients History and Physical, chart, labs and discussed the procedure including the risks, benefits and alternatives for the proposed anesthesia with the patient or authorized representative who has indicated his/her understanding and acceptance.   Dental advisory given  Plan Discussed with: CRNA and Anesthesiologist  Anesthesia Plan Comments:         Anesthesia Quick Evaluation

## 2018-08-26 NOTE — Op Note (Signed)
08/26/2018 [facility]                              OPERATIVE REPORT   PREOPERATIVE DIAGNOSIS:  Left carpal tunnel syndrome.  POSTOPERATIVE DIAGNOSIS:  Left carpal tunnel syndrome.  PROCEDURE:  Left carpal tunnel release.  SURGEON:  Betha LoaKevin Remmy Crass, MD  ASSISTANT:  none.  ANESTHESIA: Bier block with sedation  IV FLUIDS:  Per anesthesia flow sheet.  ESTIMATED BLOOD LOSS:  Minimal.  COMPLICATIONS:  None.  SPECIMENS:  None.  TOURNIQUET TIME:    Total Tourniquet Time Documented: Forearm (Left) - 30 minutes Total: Forearm (Left) - 30 minutes   DISPOSITION:  Stable to PACU.  LOCATION: [facility]  INDICATIONS:  68 yo male with numbness and tingling left hand.  Positive nerve conduction studies.   He wishes to have a carpal tunnel release for management of his symptoms.  Risks, benefits and alternatives of surgery were discussed including the risk of blood loss; infection; damage to nerves, vessels, tendons, ligaments, bone; failure of surgery; need for additional surgery; complications with wound healing; continued pain; recurrence of carpal tunnel syndrome; and damage to motor branch. He voiced understanding of these risks and elected to proceed.   OPERATIVE COURSE:  After being identified preoperatively by myself, the patient and I agreed upon the procedure and site of procedure.  The surgical site was marked.  The risks, benefits, and alternatives of the surgery were reviewed and he wished to proceed.  Surgical consent had been signed.  He was given IV Ancef as preoperative antibiotic prophylaxis.  He was transferred to the operating room and placed on the operating room table in supine position with the Left upper extremity on an armboard.  Bier block anesthesia was induced by the anesthesiologist.  Left upper extremity was prepped and draped in normal sterile orthopaedic fashion.  A surgical pause was performed between the surgeons, anesthesia, and  operating room staff, and all were in agreement as to the patient, procedure, and site of procedure.  Tourniquet at the proximal aspect of the forearm had been inflated for the Bier block  Incision was made over the transverse carpal ligament and carried into the subcutaneous tissues by spreading technique.  Bipolar electrocautery was used to obtain hemostasis.  The palmar fascia was sharply incised.  The transverse carpal ligament was identified and sharply incised.  It was incised distally first.  Care was taken to ensure complete decompression distally.  It was then incised proximally.  Scissors were used to split the distal aspect of the volar antebrachial fascia.  A finger was placed into the wound to ensure complete decompression, which was the case.  The nerve was examined.  It was flattened and hyperemic. and It was adherent to the radial leaflet.  The motor branch was identified and was intact.  The wound was copiously irrigated with sterile saline.  It was then closed with 4-0 nylon in a horizontal mattress fashion.  It was injected with 0.25% plain Marcaine to aid in postoperative analgesia.  It was dressed with sterile Xeroform, 4x4s, an ABD, and wrapped with Kerlix and an Ace bandage.  Tourniquet was deflated at 30 minutes.  Fingertips were pink with brisk capillary refill after deflation of the tourniquet.  Operative drapes were broken down.  The patient was awoken from anesthesia safely.  He was transferred back to stretcher and taken to the PACU in stable condition.  I  will see him back in the office in 1 week for postoperative followup.  I will give him a prescription for Norco 5/325 1-2 tabs PO q6 hours prn pain, dispense # 20.    Betha Loa, MD Electronically signed, 08/26/18

## 2018-08-26 NOTE — H&P (Signed)
  Eric Massey is an 68 y.o. male.   Chief Complaint: left carpal tunnel syndrome HPI: 68 yo male with numbness and tingling left hand.  Nocturnal symptoms.  Positive nerve conduction studies.  He wishes to have a left carpal tunnel release.  Allergies: No Known Allergies  Past Medical History:  Diagnosis Date  . Arthritis    hands  . Carpal tunnel syndrome on both sides   . Hypertension     Past Surgical History:  Procedure Laterality Date  . CARPAL TUNNEL RELEASE Right   . COLONOSCOPY    . EYE SURGERY      Family History: History reviewed. No pertinent family history.  Social History:   reports that he has quit smoking. He has never used smokeless tobacco. He reports previous alcohol use. He reports that he does not use drugs.  Medications: Medications Prior to Admission  Medication Sig Dispense Refill  . amLODipine (NORVASC) 5 MG tablet TK 1 T PO QD    . aspirin EC 81 MG tablet Take by mouth.    . chlorthalidone (HYGROTON) 25 MG tablet   8  . Cholecalciferol (VITAMIN D3) 2000 units capsule Take by mouth.      No results found for this or any previous visit (from the past 48 hour(s)).  No results found.   A comprehensive review of systems was negative.  Blood pressure (!) 138/96, pulse 70, temperature 97.9 F (36.6 C), temperature source Oral, resp. rate 18, height 6' (1.829 m), weight 85.2 kg, SpO2 100 %.  General appearance: alert, cooperative and appears stated age Head: Normocephalic, without obvious abnormality, atraumatic Neck: supple, symmetrical, trachea midline Cardio: regular rate and rhythm Resp: clear to auscultation bilaterally Extremities: Intact sensation and capillary refill all digits.  +epl/fpl/io.  No wounds.  Pulses: 2+ and symmetric Skin: Skin color, texture, turgor normal. No rashes or lesions Neurologic: Grossly normal Incision/Wound: none  Assessment/Plan Left carpal tunnel syndrome.  Non operative and operative treatment  options have been discussed with the patient and patient wishes to proceed with operative treatment. Risks, benefits, and alternatives of surgery have been discussed and the patient agrees with the plan of care.   Betha Loa 08/26/2018, 9:21 AM

## 2018-08-26 NOTE — Transfer of Care (Signed)
Immediate Anesthesia Transfer of Care Note  Patient: Eric Massey  Procedure(s) Performed: LEFT CARPAL TUNNEL RELEASE (Left Wrist)  Patient Location: PACU  Anesthesia Type:MAC and Bier block  Level of Consciousness: awake, alert  and oriented  Airway & Oxygen Therapy: Patient Spontanous Breathing and Patient connected to face mask oxygen  Post-op Assessment: Report given to RN and Post -op Vital signs reviewed and stable  Post vital signs: Reviewed and stable  Last Vitals:  Vitals Value Taken Time  BP    Temp    Pulse 60 08/26/2018 10:20 AM  Resp 15 08/26/2018 10:20 AM  SpO2 100 % 08/26/2018 10:20 AM  Vitals shown include unvalidated device data.  Last Pain:  Vitals:   08/26/18 0759  TempSrc: Oral  PainSc: 0-No pain         Complications: No apparent anesthesia complications

## 2018-08-26 NOTE — Discharge Instructions (Addendum)

## 2018-08-26 NOTE — Anesthesia Postprocedure Evaluation (Signed)
Anesthesia Post Note  Patient: Eric Massey  Procedure(s) Performed: LEFT CARPAL TUNNEL RELEASE (Left Wrist)     Patient location during evaluation: PACU Anesthesia Type: Bier Block Level of consciousness: awake and alert Pain management: pain level controlled Vital Signs Assessment: post-procedure vital signs reviewed and stable Respiratory status: spontaneous breathing and respiratory function stable Cardiovascular status: stable Postop Assessment: no apparent nausea or vomiting Anesthetic complications: no    Last Vitals:  Vitals:   08/26/18 1045 08/26/18 1058  BP: (!) 148/89 (!) 145/90  Pulse: 60 71  Resp: 14 16  Temp:  36.4 C  SpO2: 99% 100%    Last Pain:  Vitals:   08/26/18 1058  TempSrc: Oral  PainSc: 0-No pain                 Natalija Mavis DANIEL

## 2018-08-27 ENCOUNTER — Encounter (HOSPITAL_BASED_OUTPATIENT_CLINIC_OR_DEPARTMENT_OTHER): Payer: Self-pay | Admitting: Orthopedic Surgery

## 2018-10-05 DIAGNOSIS — M9903 Segmental and somatic dysfunction of lumbar region: Secondary | ICD-10-CM | POA: Diagnosis not present

## 2018-10-05 DIAGNOSIS — M9904 Segmental and somatic dysfunction of sacral region: Secondary | ICD-10-CM | POA: Diagnosis not present

## 2018-10-05 DIAGNOSIS — M5136 Other intervertebral disc degeneration, lumbar region: Secondary | ICD-10-CM | POA: Diagnosis not present

## 2018-10-05 DIAGNOSIS — M9905 Segmental and somatic dysfunction of pelvic region: Secondary | ICD-10-CM | POA: Diagnosis not present

## 2018-10-06 DIAGNOSIS — M9905 Segmental and somatic dysfunction of pelvic region: Secondary | ICD-10-CM | POA: Diagnosis not present

## 2018-10-06 DIAGNOSIS — M5136 Other intervertebral disc degeneration, lumbar region: Secondary | ICD-10-CM | POA: Diagnosis not present

## 2018-10-06 DIAGNOSIS — M9903 Segmental and somatic dysfunction of lumbar region: Secondary | ICD-10-CM | POA: Diagnosis not present

## 2018-10-06 DIAGNOSIS — M9904 Segmental and somatic dysfunction of sacral region: Secondary | ICD-10-CM | POA: Diagnosis not present

## 2018-10-07 DIAGNOSIS — M9903 Segmental and somatic dysfunction of lumbar region: Secondary | ICD-10-CM | POA: Diagnosis not present

## 2018-10-07 DIAGNOSIS — M5136 Other intervertebral disc degeneration, lumbar region: Secondary | ICD-10-CM | POA: Diagnosis not present

## 2018-10-07 DIAGNOSIS — M9905 Segmental and somatic dysfunction of pelvic region: Secondary | ICD-10-CM | POA: Diagnosis not present

## 2018-10-07 DIAGNOSIS — M9904 Segmental and somatic dysfunction of sacral region: Secondary | ICD-10-CM | POA: Diagnosis not present

## 2018-10-12 DIAGNOSIS — M9904 Segmental and somatic dysfunction of sacral region: Secondary | ICD-10-CM | POA: Diagnosis not present

## 2018-10-12 DIAGNOSIS — M9905 Segmental and somatic dysfunction of pelvic region: Secondary | ICD-10-CM | POA: Diagnosis not present

## 2018-10-12 DIAGNOSIS — M9903 Segmental and somatic dysfunction of lumbar region: Secondary | ICD-10-CM | POA: Diagnosis not present

## 2018-10-12 DIAGNOSIS — M5136 Other intervertebral disc degeneration, lumbar region: Secondary | ICD-10-CM | POA: Diagnosis not present

## 2018-10-14 DIAGNOSIS — M9904 Segmental and somatic dysfunction of sacral region: Secondary | ICD-10-CM | POA: Diagnosis not present

## 2018-10-14 DIAGNOSIS — M5136 Other intervertebral disc degeneration, lumbar region: Secondary | ICD-10-CM | POA: Diagnosis not present

## 2018-10-14 DIAGNOSIS — M9903 Segmental and somatic dysfunction of lumbar region: Secondary | ICD-10-CM | POA: Diagnosis not present

## 2018-10-14 DIAGNOSIS — M9905 Segmental and somatic dysfunction of pelvic region: Secondary | ICD-10-CM | POA: Diagnosis not present

## 2018-10-19 DIAGNOSIS — M9905 Segmental and somatic dysfunction of pelvic region: Secondary | ICD-10-CM | POA: Diagnosis not present

## 2018-10-19 DIAGNOSIS — M9903 Segmental and somatic dysfunction of lumbar region: Secondary | ICD-10-CM | POA: Diagnosis not present

## 2018-10-19 DIAGNOSIS — M5136 Other intervertebral disc degeneration, lumbar region: Secondary | ICD-10-CM | POA: Diagnosis not present

## 2018-10-19 DIAGNOSIS — M9904 Segmental and somatic dysfunction of sacral region: Secondary | ICD-10-CM | POA: Diagnosis not present

## 2018-10-26 DIAGNOSIS — M9903 Segmental and somatic dysfunction of lumbar region: Secondary | ICD-10-CM | POA: Diagnosis not present

## 2018-10-26 DIAGNOSIS — M5136 Other intervertebral disc degeneration, lumbar region: Secondary | ICD-10-CM | POA: Diagnosis not present

## 2018-10-26 DIAGNOSIS — M9905 Segmental and somatic dysfunction of pelvic region: Secondary | ICD-10-CM | POA: Diagnosis not present

## 2018-10-26 DIAGNOSIS — M9904 Segmental and somatic dysfunction of sacral region: Secondary | ICD-10-CM | POA: Diagnosis not present

## 2018-10-28 DIAGNOSIS — M9905 Segmental and somatic dysfunction of pelvic region: Secondary | ICD-10-CM | POA: Diagnosis not present

## 2018-10-28 DIAGNOSIS — M9904 Segmental and somatic dysfunction of sacral region: Secondary | ICD-10-CM | POA: Diagnosis not present

## 2018-10-28 DIAGNOSIS — M9903 Segmental and somatic dysfunction of lumbar region: Secondary | ICD-10-CM | POA: Diagnosis not present

## 2018-10-28 DIAGNOSIS — M5136 Other intervertebral disc degeneration, lumbar region: Secondary | ICD-10-CM | POA: Diagnosis not present

## 2018-11-04 DIAGNOSIS — M5136 Other intervertebral disc degeneration, lumbar region: Secondary | ICD-10-CM | POA: Diagnosis not present

## 2018-11-04 DIAGNOSIS — M9903 Segmental and somatic dysfunction of lumbar region: Secondary | ICD-10-CM | POA: Diagnosis not present

## 2018-11-04 DIAGNOSIS — M9904 Segmental and somatic dysfunction of sacral region: Secondary | ICD-10-CM | POA: Diagnosis not present

## 2018-11-04 DIAGNOSIS — M9905 Segmental and somatic dysfunction of pelvic region: Secondary | ICD-10-CM | POA: Diagnosis not present

## 2018-11-08 DIAGNOSIS — M9903 Segmental and somatic dysfunction of lumbar region: Secondary | ICD-10-CM | POA: Diagnosis not present

## 2018-11-08 DIAGNOSIS — M9904 Segmental and somatic dysfunction of sacral region: Secondary | ICD-10-CM | POA: Diagnosis not present

## 2018-11-08 DIAGNOSIS — M5136 Other intervertebral disc degeneration, lumbar region: Secondary | ICD-10-CM | POA: Diagnosis not present

## 2018-11-08 DIAGNOSIS — M9905 Segmental and somatic dysfunction of pelvic region: Secondary | ICD-10-CM | POA: Diagnosis not present

## 2018-11-10 DIAGNOSIS — M9903 Segmental and somatic dysfunction of lumbar region: Secondary | ICD-10-CM | POA: Diagnosis not present

## 2018-11-10 DIAGNOSIS — M9905 Segmental and somatic dysfunction of pelvic region: Secondary | ICD-10-CM | POA: Diagnosis not present

## 2018-11-10 DIAGNOSIS — M9904 Segmental and somatic dysfunction of sacral region: Secondary | ICD-10-CM | POA: Diagnosis not present

## 2018-11-10 DIAGNOSIS — M5136 Other intervertebral disc degeneration, lumbar region: Secondary | ICD-10-CM | POA: Diagnosis not present

## 2018-11-16 DIAGNOSIS — M9903 Segmental and somatic dysfunction of lumbar region: Secondary | ICD-10-CM | POA: Diagnosis not present

## 2018-11-16 DIAGNOSIS — M5136 Other intervertebral disc degeneration, lumbar region: Secondary | ICD-10-CM | POA: Diagnosis not present

## 2018-11-16 DIAGNOSIS — M9904 Segmental and somatic dysfunction of sacral region: Secondary | ICD-10-CM | POA: Diagnosis not present

## 2018-11-16 DIAGNOSIS — M9905 Segmental and somatic dysfunction of pelvic region: Secondary | ICD-10-CM | POA: Diagnosis not present

## 2018-12-01 DIAGNOSIS — M9903 Segmental and somatic dysfunction of lumbar region: Secondary | ICD-10-CM | POA: Diagnosis not present

## 2018-12-01 DIAGNOSIS — M5136 Other intervertebral disc degeneration, lumbar region: Secondary | ICD-10-CM | POA: Diagnosis not present

## 2018-12-01 DIAGNOSIS — M9905 Segmental and somatic dysfunction of pelvic region: Secondary | ICD-10-CM | POA: Diagnosis not present

## 2018-12-01 DIAGNOSIS — M9904 Segmental and somatic dysfunction of sacral region: Secondary | ICD-10-CM | POA: Diagnosis not present

## 2018-12-09 DIAGNOSIS — M9904 Segmental and somatic dysfunction of sacral region: Secondary | ICD-10-CM | POA: Diagnosis not present

## 2018-12-09 DIAGNOSIS — M5136 Other intervertebral disc degeneration, lumbar region: Secondary | ICD-10-CM | POA: Diagnosis not present

## 2018-12-09 DIAGNOSIS — M9903 Segmental and somatic dysfunction of lumbar region: Secondary | ICD-10-CM | POA: Diagnosis not present

## 2018-12-09 DIAGNOSIS — M9905 Segmental and somatic dysfunction of pelvic region: Secondary | ICD-10-CM | POA: Diagnosis not present

## 2018-12-23 DIAGNOSIS — M9905 Segmental and somatic dysfunction of pelvic region: Secondary | ICD-10-CM | POA: Diagnosis not present

## 2018-12-23 DIAGNOSIS — M9903 Segmental and somatic dysfunction of lumbar region: Secondary | ICD-10-CM | POA: Diagnosis not present

## 2018-12-23 DIAGNOSIS — M9904 Segmental and somatic dysfunction of sacral region: Secondary | ICD-10-CM | POA: Diagnosis not present

## 2018-12-23 DIAGNOSIS — M5136 Other intervertebral disc degeneration, lumbar region: Secondary | ICD-10-CM | POA: Diagnosis not present

## 2019-01-06 DIAGNOSIS — M9904 Segmental and somatic dysfunction of sacral region: Secondary | ICD-10-CM | POA: Diagnosis not present

## 2019-01-06 DIAGNOSIS — M5136 Other intervertebral disc degeneration, lumbar region: Secondary | ICD-10-CM | POA: Diagnosis not present

## 2019-01-06 DIAGNOSIS — M9905 Segmental and somatic dysfunction of pelvic region: Secondary | ICD-10-CM | POA: Diagnosis not present

## 2019-01-06 DIAGNOSIS — M9903 Segmental and somatic dysfunction of lumbar region: Secondary | ICD-10-CM | POA: Diagnosis not present

## 2019-01-27 DIAGNOSIS — M9903 Segmental and somatic dysfunction of lumbar region: Secondary | ICD-10-CM | POA: Diagnosis not present

## 2019-01-27 DIAGNOSIS — M5136 Other intervertebral disc degeneration, lumbar region: Secondary | ICD-10-CM | POA: Diagnosis not present

## 2019-01-27 DIAGNOSIS — M9905 Segmental and somatic dysfunction of pelvic region: Secondary | ICD-10-CM | POA: Diagnosis not present

## 2019-01-27 DIAGNOSIS — M9904 Segmental and somatic dysfunction of sacral region: Secondary | ICD-10-CM | POA: Diagnosis not present

## 2019-01-31 DIAGNOSIS — R972 Elevated prostate specific antigen [PSA]: Secondary | ICD-10-CM | POA: Diagnosis not present

## 2019-02-24 DIAGNOSIS — M9903 Segmental and somatic dysfunction of lumbar region: Secondary | ICD-10-CM | POA: Diagnosis not present

## 2019-02-24 DIAGNOSIS — M5136 Other intervertebral disc degeneration, lumbar region: Secondary | ICD-10-CM | POA: Diagnosis not present

## 2019-02-24 DIAGNOSIS — M9904 Segmental and somatic dysfunction of sacral region: Secondary | ICD-10-CM | POA: Diagnosis not present

## 2019-02-24 DIAGNOSIS — M9905 Segmental and somatic dysfunction of pelvic region: Secondary | ICD-10-CM | POA: Diagnosis not present

## 2019-03-15 DIAGNOSIS — N400 Enlarged prostate without lower urinary tract symptoms: Secondary | ICD-10-CM | POA: Diagnosis not present

## 2019-03-15 DIAGNOSIS — R972 Elevated prostate specific antigen [PSA]: Secondary | ICD-10-CM | POA: Diagnosis not present

## 2019-03-17 DIAGNOSIS — M9903 Segmental and somatic dysfunction of lumbar region: Secondary | ICD-10-CM | POA: Diagnosis not present

## 2019-03-17 DIAGNOSIS — M5136 Other intervertebral disc degeneration, lumbar region: Secondary | ICD-10-CM | POA: Diagnosis not present

## 2019-03-17 DIAGNOSIS — M9904 Segmental and somatic dysfunction of sacral region: Secondary | ICD-10-CM | POA: Diagnosis not present

## 2019-03-17 DIAGNOSIS — M9905 Segmental and somatic dysfunction of pelvic region: Secondary | ICD-10-CM | POA: Diagnosis not present

## 2019-03-29 DIAGNOSIS — L989 Disorder of the skin and subcutaneous tissue, unspecified: Secondary | ICD-10-CM | POA: Diagnosis not present

## 2019-03-31 DIAGNOSIS — B9689 Other specified bacterial agents as the cause of diseases classified elsewhere: Secondary | ICD-10-CM | POA: Diagnosis not present

## 2019-03-31 DIAGNOSIS — L0202 Furuncle of face: Secondary | ICD-10-CM | POA: Diagnosis not present

## 2019-04-14 DIAGNOSIS — M5136 Other intervertebral disc degeneration, lumbar region: Secondary | ICD-10-CM | POA: Diagnosis not present

## 2019-04-14 DIAGNOSIS — M9903 Segmental and somatic dysfunction of lumbar region: Secondary | ICD-10-CM | POA: Diagnosis not present

## 2019-04-14 DIAGNOSIS — M9905 Segmental and somatic dysfunction of pelvic region: Secondary | ICD-10-CM | POA: Diagnosis not present

## 2019-04-14 DIAGNOSIS — M9904 Segmental and somatic dysfunction of sacral region: Secondary | ICD-10-CM | POA: Diagnosis not present

## 2019-04-15 DIAGNOSIS — L905 Scar conditions and fibrosis of skin: Secondary | ICD-10-CM | POA: Diagnosis not present

## 2019-04-18 DIAGNOSIS — M5136 Other intervertebral disc degeneration, lumbar region: Secondary | ICD-10-CM | POA: Diagnosis not present

## 2019-04-18 DIAGNOSIS — M9904 Segmental and somatic dysfunction of sacral region: Secondary | ICD-10-CM | POA: Diagnosis not present

## 2019-04-18 DIAGNOSIS — M9905 Segmental and somatic dysfunction of pelvic region: Secondary | ICD-10-CM | POA: Diagnosis not present

## 2019-04-18 DIAGNOSIS — M9903 Segmental and somatic dysfunction of lumbar region: Secondary | ICD-10-CM | POA: Diagnosis not present

## 2019-04-19 DIAGNOSIS — M9905 Segmental and somatic dysfunction of pelvic region: Secondary | ICD-10-CM | POA: Diagnosis not present

## 2019-04-19 DIAGNOSIS — M9904 Segmental and somatic dysfunction of sacral region: Secondary | ICD-10-CM | POA: Diagnosis not present

## 2019-04-19 DIAGNOSIS — M9903 Segmental and somatic dysfunction of lumbar region: Secondary | ICD-10-CM | POA: Diagnosis not present

## 2019-04-19 DIAGNOSIS — M5136 Other intervertebral disc degeneration, lumbar region: Secondary | ICD-10-CM | POA: Diagnosis not present

## 2019-04-21 DIAGNOSIS — M9905 Segmental and somatic dysfunction of pelvic region: Secondary | ICD-10-CM | POA: Diagnosis not present

## 2019-04-21 DIAGNOSIS — M9904 Segmental and somatic dysfunction of sacral region: Secondary | ICD-10-CM | POA: Diagnosis not present

## 2019-04-21 DIAGNOSIS — M9903 Segmental and somatic dysfunction of lumbar region: Secondary | ICD-10-CM | POA: Diagnosis not present

## 2019-04-21 DIAGNOSIS — M5136 Other intervertebral disc degeneration, lumbar region: Secondary | ICD-10-CM | POA: Diagnosis not present

## 2019-05-02 DIAGNOSIS — M9904 Segmental and somatic dysfunction of sacral region: Secondary | ICD-10-CM | POA: Diagnosis not present

## 2019-05-02 DIAGNOSIS — M5136 Other intervertebral disc degeneration, lumbar region: Secondary | ICD-10-CM | POA: Diagnosis not present

## 2019-05-02 DIAGNOSIS — M9903 Segmental and somatic dysfunction of lumbar region: Secondary | ICD-10-CM | POA: Diagnosis not present

## 2019-05-02 DIAGNOSIS — M9905 Segmental and somatic dysfunction of pelvic region: Secondary | ICD-10-CM | POA: Diagnosis not present

## 2019-05-09 DIAGNOSIS — M9904 Segmental and somatic dysfunction of sacral region: Secondary | ICD-10-CM | POA: Diagnosis not present

## 2019-05-09 DIAGNOSIS — M5136 Other intervertebral disc degeneration, lumbar region: Secondary | ICD-10-CM | POA: Diagnosis not present

## 2019-05-09 DIAGNOSIS — M9903 Segmental and somatic dysfunction of lumbar region: Secondary | ICD-10-CM | POA: Diagnosis not present

## 2019-05-09 DIAGNOSIS — M9905 Segmental and somatic dysfunction of pelvic region: Secondary | ICD-10-CM | POA: Diagnosis not present

## 2019-05-16 DIAGNOSIS — M5136 Other intervertebral disc degeneration, lumbar region: Secondary | ICD-10-CM | POA: Diagnosis not present

## 2019-05-16 DIAGNOSIS — M9904 Segmental and somatic dysfunction of sacral region: Secondary | ICD-10-CM | POA: Diagnosis not present

## 2019-05-16 DIAGNOSIS — M9905 Segmental and somatic dysfunction of pelvic region: Secondary | ICD-10-CM | POA: Diagnosis not present

## 2019-05-16 DIAGNOSIS — M9903 Segmental and somatic dysfunction of lumbar region: Secondary | ICD-10-CM | POA: Diagnosis not present

## 2019-05-30 DIAGNOSIS — M9903 Segmental and somatic dysfunction of lumbar region: Secondary | ICD-10-CM | POA: Diagnosis not present

## 2019-05-30 DIAGNOSIS — M9905 Segmental and somatic dysfunction of pelvic region: Secondary | ICD-10-CM | POA: Diagnosis not present

## 2019-05-30 DIAGNOSIS — M9904 Segmental and somatic dysfunction of sacral region: Secondary | ICD-10-CM | POA: Diagnosis not present

## 2019-05-30 DIAGNOSIS — M5136 Other intervertebral disc degeneration, lumbar region: Secondary | ICD-10-CM | POA: Diagnosis not present

## 2019-06-06 DIAGNOSIS — M9905 Segmental and somatic dysfunction of pelvic region: Secondary | ICD-10-CM | POA: Diagnosis not present

## 2019-06-06 DIAGNOSIS — M5136 Other intervertebral disc degeneration, lumbar region: Secondary | ICD-10-CM | POA: Diagnosis not present

## 2019-06-06 DIAGNOSIS — M9903 Segmental and somatic dysfunction of lumbar region: Secondary | ICD-10-CM | POA: Diagnosis not present

## 2019-06-06 DIAGNOSIS — M9904 Segmental and somatic dysfunction of sacral region: Secondary | ICD-10-CM | POA: Diagnosis not present

## 2019-06-13 DIAGNOSIS — M79641 Pain in right hand: Secondary | ICD-10-CM | POA: Diagnosis not present

## 2019-06-13 DIAGNOSIS — M10031 Idiopathic gout, right wrist: Secondary | ICD-10-CM | POA: Diagnosis not present

## 2019-06-16 DIAGNOSIS — M7989 Other specified soft tissue disorders: Secondary | ICD-10-CM | POA: Diagnosis not present

## 2019-06-16 DIAGNOSIS — M79641 Pain in right hand: Secondary | ICD-10-CM | POA: Diagnosis not present

## 2019-06-16 DIAGNOSIS — M10041 Idiopathic gout, right hand: Secondary | ICD-10-CM | POA: Diagnosis not present

## 2019-06-20 DIAGNOSIS — M5136 Other intervertebral disc degeneration, lumbar region: Secondary | ICD-10-CM | POA: Diagnosis not present

## 2019-06-20 DIAGNOSIS — M9905 Segmental and somatic dysfunction of pelvic region: Secondary | ICD-10-CM | POA: Diagnosis not present

## 2019-06-20 DIAGNOSIS — M9904 Segmental and somatic dysfunction of sacral region: Secondary | ICD-10-CM | POA: Diagnosis not present

## 2019-06-20 DIAGNOSIS — M9903 Segmental and somatic dysfunction of lumbar region: Secondary | ICD-10-CM | POA: Diagnosis not present

## 2019-07-11 DIAGNOSIS — M9903 Segmental and somatic dysfunction of lumbar region: Secondary | ICD-10-CM | POA: Diagnosis not present

## 2019-07-11 DIAGNOSIS — M5136 Other intervertebral disc degeneration, lumbar region: Secondary | ICD-10-CM | POA: Diagnosis not present

## 2019-07-11 DIAGNOSIS — M9904 Segmental and somatic dysfunction of sacral region: Secondary | ICD-10-CM | POA: Diagnosis not present

## 2019-07-11 DIAGNOSIS — M9905 Segmental and somatic dysfunction of pelvic region: Secondary | ICD-10-CM | POA: Diagnosis not present

## 2019-08-24 DIAGNOSIS — R972 Elevated prostate specific antigen [PSA]: Secondary | ICD-10-CM | POA: Diagnosis not present

## 2019-08-29 DIAGNOSIS — N5201 Erectile dysfunction due to arterial insufficiency: Secondary | ICD-10-CM | POA: Diagnosis not present

## 2019-08-29 DIAGNOSIS — R972 Elevated prostate specific antigen [PSA]: Secondary | ICD-10-CM | POA: Diagnosis not present

## 2019-08-29 DIAGNOSIS — N400 Enlarged prostate without lower urinary tract symptoms: Secondary | ICD-10-CM | POA: Diagnosis not present

## 2019-08-30 ENCOUNTER — Ambulatory Visit: Payer: Medicare Other | Attending: Internal Medicine

## 2019-08-30 DIAGNOSIS — Z20822 Contact with and (suspected) exposure to covid-19: Secondary | ICD-10-CM | POA: Diagnosis not present

## 2019-09-01 LAB — NOVEL CORONAVIRUS, NAA: SARS-CoV-2, NAA: NOT DETECTED

## 2019-09-29 DIAGNOSIS — M109 Gout, unspecified: Secondary | ICD-10-CM | POA: Diagnosis not present

## 2019-10-27 DIAGNOSIS — I10 Essential (primary) hypertension: Secondary | ICD-10-CM | POA: Diagnosis not present

## 2019-10-27 DIAGNOSIS — Z0000 Encounter for general adult medical examination without abnormal findings: Secondary | ICD-10-CM | POA: Diagnosis not present

## 2019-12-09 DIAGNOSIS — R7303 Prediabetes: Secondary | ICD-10-CM | POA: Diagnosis not present

## 2019-12-09 DIAGNOSIS — E559 Vitamin D deficiency, unspecified: Secondary | ICD-10-CM | POA: Diagnosis not present

## 2019-12-09 DIAGNOSIS — E039 Hypothyroidism, unspecified: Secondary | ICD-10-CM | POA: Diagnosis not present

## 2019-12-09 DIAGNOSIS — I10 Essential (primary) hypertension: Secondary | ICD-10-CM | POA: Diagnosis not present

## 2019-12-16 DIAGNOSIS — M5412 Radiculopathy, cervical region: Secondary | ICD-10-CM | POA: Diagnosis not present

## 2019-12-16 DIAGNOSIS — R05 Cough: Secondary | ICD-10-CM | POA: Diagnosis not present

## 2019-12-16 DIAGNOSIS — J45909 Unspecified asthma, uncomplicated: Secondary | ICD-10-CM | POA: Diagnosis not present

## 2019-12-16 DIAGNOSIS — M1990 Unspecified osteoarthritis, unspecified site: Secondary | ICD-10-CM | POA: Diagnosis not present

## 2019-12-16 DIAGNOSIS — E785 Hyperlipidemia, unspecified: Secondary | ICD-10-CM | POA: Diagnosis not present

## 2019-12-16 DIAGNOSIS — I10 Essential (primary) hypertension: Secondary | ICD-10-CM | POA: Diagnosis not present

## 2019-12-16 DIAGNOSIS — Z0000 Encounter for general adult medical examination without abnormal findings: Secondary | ICD-10-CM | POA: Diagnosis not present

## 2019-12-16 DIAGNOSIS — J3489 Other specified disorders of nose and nasal sinuses: Secondary | ICD-10-CM | POA: Diagnosis not present

## 2019-12-16 DIAGNOSIS — E119 Type 2 diabetes mellitus without complications: Secondary | ICD-10-CM | POA: Diagnosis not present

## 2019-12-16 DIAGNOSIS — R2681 Unsteadiness on feet: Secondary | ICD-10-CM | POA: Diagnosis not present

## 2019-12-21 DIAGNOSIS — Z0000 Encounter for general adult medical examination without abnormal findings: Secondary | ICD-10-CM | POA: Diagnosis not present

## 2019-12-21 DIAGNOSIS — R972 Elevated prostate specific antigen [PSA]: Secondary | ICD-10-CM | POA: Diagnosis not present

## 2019-12-21 DIAGNOSIS — I10 Essential (primary) hypertension: Secondary | ICD-10-CM | POA: Diagnosis not present

## 2019-12-21 DIAGNOSIS — N529 Male erectile dysfunction, unspecified: Secondary | ICD-10-CM | POA: Diagnosis not present

## 2020-02-08 DIAGNOSIS — Z7982 Long term (current) use of aspirin: Secondary | ICD-10-CM | POA: Diagnosis not present

## 2020-02-08 DIAGNOSIS — Z833 Family history of diabetes mellitus: Secondary | ICD-10-CM | POA: Diagnosis not present

## 2020-02-08 DIAGNOSIS — Z8249 Family history of ischemic heart disease and other diseases of the circulatory system: Secondary | ICD-10-CM | POA: Diagnosis not present

## 2020-02-08 DIAGNOSIS — Z87891 Personal history of nicotine dependence: Secondary | ICD-10-CM | POA: Diagnosis not present

## 2020-02-08 DIAGNOSIS — I10 Essential (primary) hypertension: Secondary | ICD-10-CM | POA: Diagnosis not present

## 2020-02-21 DIAGNOSIS — R972 Elevated prostate specific antigen [PSA]: Secondary | ICD-10-CM | POA: Diagnosis not present

## 2020-02-22 DIAGNOSIS — R1032 Left lower quadrant pain: Secondary | ICD-10-CM | POA: Diagnosis not present

## 2020-02-22 DIAGNOSIS — M1612 Unilateral primary osteoarthritis, left hip: Secondary | ICD-10-CM | POA: Diagnosis not present

## 2020-02-22 DIAGNOSIS — M7918 Myalgia, other site: Secondary | ICD-10-CM | POA: Diagnosis not present

## 2020-02-22 DIAGNOSIS — M25552 Pain in left hip: Secondary | ICD-10-CM | POA: Diagnosis not present

## 2020-02-28 DIAGNOSIS — N5201 Erectile dysfunction due to arterial insufficiency: Secondary | ICD-10-CM | POA: Diagnosis not present

## 2020-02-28 DIAGNOSIS — R972 Elevated prostate specific antigen [PSA]: Secondary | ICD-10-CM | POA: Diagnosis not present

## 2020-03-20 DIAGNOSIS — M25552 Pain in left hip: Secondary | ICD-10-CM | POA: Diagnosis not present

## 2020-03-27 DIAGNOSIS — M25552 Pain in left hip: Secondary | ICD-10-CM | POA: Diagnosis not present

## 2020-04-03 DIAGNOSIS — M25552 Pain in left hip: Secondary | ICD-10-CM | POA: Diagnosis not present

## 2020-04-10 DIAGNOSIS — M25552 Pain in left hip: Secondary | ICD-10-CM | POA: Diagnosis not present

## 2020-04-17 DIAGNOSIS — M25552 Pain in left hip: Secondary | ICD-10-CM | POA: Diagnosis not present

## 2020-05-18 DIAGNOSIS — Z23 Encounter for immunization: Secondary | ICD-10-CM | POA: Diagnosis not present

## 2020-06-08 DIAGNOSIS — I10 Essential (primary) hypertension: Secondary | ICD-10-CM | POA: Diagnosis not present

## 2020-06-08 DIAGNOSIS — E039 Hypothyroidism, unspecified: Secondary | ICD-10-CM | POA: Diagnosis not present

## 2020-06-08 DIAGNOSIS — E785 Hyperlipidemia, unspecified: Secondary | ICD-10-CM | POA: Diagnosis not present

## 2020-06-08 DIAGNOSIS — E559 Vitamin D deficiency, unspecified: Secondary | ICD-10-CM | POA: Diagnosis not present

## 2020-06-08 DIAGNOSIS — E119 Type 2 diabetes mellitus without complications: Secondary | ICD-10-CM | POA: Diagnosis not present

## 2020-06-15 DIAGNOSIS — E785 Hyperlipidemia, unspecified: Secondary | ICD-10-CM | POA: Diagnosis not present

## 2020-06-15 DIAGNOSIS — J45909 Unspecified asthma, uncomplicated: Secondary | ICD-10-CM | POA: Diagnosis not present

## 2020-06-15 DIAGNOSIS — M5412 Radiculopathy, cervical region: Secondary | ICD-10-CM | POA: Diagnosis not present

## 2020-06-15 DIAGNOSIS — I10 Essential (primary) hypertension: Secondary | ICD-10-CM | POA: Diagnosis not present

## 2020-06-15 DIAGNOSIS — H8110 Benign paroxysmal vertigo, unspecified ear: Secondary | ICD-10-CM | POA: Diagnosis not present

## 2020-06-15 DIAGNOSIS — M1990 Unspecified osteoarthritis, unspecified site: Secondary | ICD-10-CM | POA: Diagnosis not present

## 2020-06-15 DIAGNOSIS — E119 Type 2 diabetes mellitus without complications: Secondary | ICD-10-CM | POA: Diagnosis not present

## 2020-06-15 DIAGNOSIS — R7303 Prediabetes: Secondary | ICD-10-CM | POA: Diagnosis not present

## 2020-06-15 DIAGNOSIS — T7840XS Allergy, unspecified, sequela: Secondary | ICD-10-CM | POA: Diagnosis not present

## 2020-06-15 DIAGNOSIS — R2681 Unsteadiness on feet: Secondary | ICD-10-CM | POA: Diagnosis not present

## 2021-01-15 DIAGNOSIS — I10 Essential (primary) hypertension: Secondary | ICD-10-CM | POA: Diagnosis not present

## 2021-01-22 DIAGNOSIS — I10 Essential (primary) hypertension: Secondary | ICD-10-CM | POA: Diagnosis not present

## 2021-01-22 DIAGNOSIS — E782 Mixed hyperlipidemia: Secondary | ICD-10-CM | POA: Diagnosis not present

## 2021-01-22 DIAGNOSIS — R7303 Prediabetes: Secondary | ICD-10-CM | POA: Diagnosis not present

## 2021-02-05 DIAGNOSIS — I10 Essential (primary) hypertension: Secondary | ICD-10-CM | POA: Diagnosis not present

## 2021-02-05 DIAGNOSIS — Z008 Encounter for other general examination: Secondary | ICD-10-CM | POA: Diagnosis not present

## 2021-02-05 DIAGNOSIS — Z87891 Personal history of nicotine dependence: Secondary | ICD-10-CM | POA: Diagnosis not present

## 2021-02-05 DIAGNOSIS — E119 Type 2 diabetes mellitus without complications: Secondary | ICD-10-CM | POA: Diagnosis not present

## 2021-02-05 DIAGNOSIS — Z7982 Long term (current) use of aspirin: Secondary | ICD-10-CM | POA: Diagnosis not present

## 2021-02-19 DIAGNOSIS — M5441 Lumbago with sciatica, right side: Secondary | ICD-10-CM | POA: Diagnosis not present

## 2021-02-19 DIAGNOSIS — M5431 Sciatica, right side: Secondary | ICD-10-CM | POA: Diagnosis not present

## 2021-03-07 DIAGNOSIS — M545 Low back pain, unspecified: Secondary | ICD-10-CM | POA: Diagnosis not present

## 2021-03-07 DIAGNOSIS — M5441 Lumbago with sciatica, right side: Secondary | ICD-10-CM | POA: Diagnosis not present

## 2021-03-07 DIAGNOSIS — M5431 Sciatica, right side: Secondary | ICD-10-CM | POA: Diagnosis not present

## 2021-03-21 DIAGNOSIS — M545 Low back pain, unspecified: Secondary | ICD-10-CM | POA: Diagnosis not present

## 2021-03-26 DIAGNOSIS — M545 Low back pain, unspecified: Secondary | ICD-10-CM | POA: Diagnosis not present

## 2021-04-11 DIAGNOSIS — M545 Low back pain, unspecified: Secondary | ICD-10-CM | POA: Diagnosis not present

## 2021-04-16 DIAGNOSIS — M545 Low back pain, unspecified: Secondary | ICD-10-CM | POA: Diagnosis not present

## 2021-04-18 DIAGNOSIS — M545 Low back pain, unspecified: Secondary | ICD-10-CM | POA: Diagnosis not present

## 2021-04-30 DIAGNOSIS — M545 Low back pain, unspecified: Secondary | ICD-10-CM | POA: Diagnosis not present

## 2021-05-02 DIAGNOSIS — M545 Low back pain, unspecified: Secondary | ICD-10-CM | POA: Diagnosis not present

## 2021-05-07 DIAGNOSIS — M545 Low back pain, unspecified: Secondary | ICD-10-CM | POA: Diagnosis not present

## 2021-05-09 DIAGNOSIS — Z23 Encounter for immunization: Secondary | ICD-10-CM | POA: Diagnosis not present

## 2021-05-09 DIAGNOSIS — I10 Essential (primary) hypertension: Secondary | ICD-10-CM | POA: Diagnosis not present

## 2021-05-09 DIAGNOSIS — E782 Mixed hyperlipidemia: Secondary | ICD-10-CM | POA: Diagnosis not present

## 2021-05-09 DIAGNOSIS — R7303 Prediabetes: Secondary | ICD-10-CM | POA: Diagnosis not present

## 2021-05-09 DIAGNOSIS — Z0000 Encounter for general adult medical examination without abnormal findings: Secondary | ICD-10-CM | POA: Diagnosis not present

## 2021-05-16 DIAGNOSIS — I10 Essential (primary) hypertension: Secondary | ICD-10-CM | POA: Diagnosis not present

## 2021-05-16 DIAGNOSIS — E782 Mixed hyperlipidemia: Secondary | ICD-10-CM | POA: Diagnosis not present

## 2021-05-16 DIAGNOSIS — R7303 Prediabetes: Secondary | ICD-10-CM | POA: Diagnosis not present

## 2021-05-16 DIAGNOSIS — N182 Chronic kidney disease, stage 2 (mild): Secondary | ICD-10-CM | POA: Diagnosis not present

## 2021-05-16 DIAGNOSIS — Z0000 Encounter for general adult medical examination without abnormal findings: Secondary | ICD-10-CM | POA: Diagnosis not present

## 2021-07-18 DIAGNOSIS — Z23 Encounter for immunization: Secondary | ICD-10-CM | POA: Diagnosis not present

## 2021-11-28 DIAGNOSIS — R7303 Prediabetes: Secondary | ICD-10-CM | POA: Diagnosis not present

## 2021-11-28 DIAGNOSIS — I10 Essential (primary) hypertension: Secondary | ICD-10-CM | POA: Diagnosis not present

## 2021-11-28 DIAGNOSIS — E782 Mixed hyperlipidemia: Secondary | ICD-10-CM | POA: Diagnosis not present

## 2021-12-05 DIAGNOSIS — Z23 Encounter for immunization: Secondary | ICD-10-CM | POA: Diagnosis not present

## 2021-12-05 DIAGNOSIS — I10 Essential (primary) hypertension: Secondary | ICD-10-CM | POA: Diagnosis not present

## 2021-12-05 DIAGNOSIS — N182 Chronic kidney disease, stage 2 (mild): Secondary | ICD-10-CM | POA: Diagnosis not present

## 2021-12-05 DIAGNOSIS — E782 Mixed hyperlipidemia: Secondary | ICD-10-CM | POA: Diagnosis not present

## 2021-12-05 DIAGNOSIS — R7303 Prediabetes: Secondary | ICD-10-CM | POA: Diagnosis not present

## 2022-01-01 DIAGNOSIS — I10 Essential (primary) hypertension: Secondary | ICD-10-CM | POA: Diagnosis not present

## 2022-01-01 DIAGNOSIS — Z1211 Encounter for screening for malignant neoplasm of colon: Secondary | ICD-10-CM | POA: Diagnosis not present

## 2022-02-06 DIAGNOSIS — Z86010 Personal history of colonic polyps: Secondary | ICD-10-CM | POA: Diagnosis not present

## 2022-02-13 DIAGNOSIS — E785 Hyperlipidemia, unspecified: Secondary | ICD-10-CM | POA: Diagnosis not present

## 2022-02-13 DIAGNOSIS — I10 Essential (primary) hypertension: Secondary | ICD-10-CM | POA: Diagnosis not present

## 2022-02-13 DIAGNOSIS — Z8249 Family history of ischemic heart disease and other diseases of the circulatory system: Secondary | ICD-10-CM | POA: Diagnosis not present

## 2022-02-13 DIAGNOSIS — Z833 Family history of diabetes mellitus: Secondary | ICD-10-CM | POA: Diagnosis not present

## 2022-02-13 DIAGNOSIS — Z809 Family history of malignant neoplasm, unspecified: Secondary | ICD-10-CM | POA: Diagnosis not present

## 2022-02-13 DIAGNOSIS — Z008 Encounter for other general examination: Secondary | ICD-10-CM | POA: Diagnosis not present

## 2022-02-13 DIAGNOSIS — Z7982 Long term (current) use of aspirin: Secondary | ICD-10-CM | POA: Diagnosis not present

## 2022-02-13 DIAGNOSIS — Z87891 Personal history of nicotine dependence: Secondary | ICD-10-CM | POA: Diagnosis not present

## 2022-02-13 DIAGNOSIS — M1990 Unspecified osteoarthritis, unspecified site: Secondary | ICD-10-CM | POA: Diagnosis not present

## 2022-03-24 ENCOUNTER — Ambulatory Visit (INDEPENDENT_AMBULATORY_CARE_PROVIDER_SITE_OTHER): Payer: Medicare HMO

## 2022-03-24 ENCOUNTER — Ambulatory Visit (INDEPENDENT_AMBULATORY_CARE_PROVIDER_SITE_OTHER): Payer: Medicare HMO | Admitting: Podiatry

## 2022-03-24 DIAGNOSIS — M7662 Achilles tendinitis, left leg: Secondary | ICD-10-CM

## 2022-03-24 DIAGNOSIS — M722 Plantar fascial fibromatosis: Secondary | ICD-10-CM

## 2022-03-24 MED ORDER — NITROGLYCERIN 0.2 MG/HR TD PT24: 0.2000 mg | MEDICATED_PATCH | Freq: Every day | TRANSDERMAL | 12 refills | Status: AC

## 2022-03-24 NOTE — Progress Notes (Signed)
Subjective:   Patient ID: Eric Massey, male   DOB: 71 y.o.   MRN: [id number]   HPI Patient presents with a lot of pain in the left plantar heel at the insertional point of the tendon into the calcaneus with fluid buildup noted.  Patient states its been going on for upwards of 1 month and does not remember injury and does not smoke likes to be active   Review of Systems  All other systems reviewed and are negative.       Objective:  Physical Exam Vitals and nursing note reviewed.  Constitutional:      Appearance: He is well-developed.  Pulmonary:     Effort: Pulmonary effort is normal.  Musculoskeletal:        General: Normal range of motion.  Skin:    General: Skin is warm.  Neurological:     Mental Status: He is alert.     Neurovascular status intact muscle strength found to be adequate range of motion adequate with patient found to have glucose discomfort in the left plantar fascia at the insertional point of the tendon into the calcaneus with fluid buildup around the medial band.  Patient has good digital perfusion well oriented x3     Assessment:  Acute Planter fasciitis left with inflammation fluid of the medial band     Plan:  H&P x-rays reviewed education concerning condition rendered to patient and at this point I went ahead did sterile prep and injected the plantar fascia left 3 mg Kenalog 5 mg Xylocaine applied fascial brace to lift up the arch gave instructions for support and discussed stretching exercises.  Patient will be seen back to recheck again in 2 weeks or earlier if needed  X-rays indicate large spur no indication stress fracture arthritis

## 2022-03-24 NOTE — Patient Instructions (Signed)

## 2022-04-24 ENCOUNTER — Ambulatory Visit: Payer: Medicare HMO | Admitting: Podiatry

## 2022-04-24 DIAGNOSIS — N400 Enlarged prostate without lower urinary tract symptoms: Secondary | ICD-10-CM | POA: Diagnosis not present

## 2022-04-30 ENCOUNTER — Telehealth: Payer: Self-pay | Admitting: Podiatry

## 2022-04-30 ENCOUNTER — Ambulatory Visit (INDEPENDENT_AMBULATORY_CARE_PROVIDER_SITE_OTHER): Payer: Medicare HMO | Admitting: Podiatry

## 2022-04-30 DIAGNOSIS — M7662 Achilles tendinitis, left leg: Secondary | ICD-10-CM | POA: Diagnosis not present

## 2022-04-30 NOTE — Progress Notes (Signed)
Subjective:   Patient ID: Eric Massey, male   DOB: 71 y.o.   MRN: [id number]   HPI Patient states he has been doing better but he did overdo it yesterday and he is started develop mild to moderate symptoms   ROS      Objective:  Physical Exam  Neurovascular status intact with plantar fascia improved but quite a bit of discomfort in the posterior heel with patient wearing nitro patches currently with diminished discomfort from previous but present     Assessment:  Planter fasciitis doing well Achilles tendinitis left     Plan:  Reviewed condition and at this point recommended the continuation of stretching ice and Nitropatch.  Encouraged to continue boot usage with gradual reduction and may require other treatments depending on symptoms

## 2022-04-30 NOTE — Telephone Encounter (Signed)
Pt wife stated a consent form was sent to [place] last week from [facility] for RX and they are still waiting to hear back from [place] to process the RX that was prescribe for today  Please advise

## 2022-05-01 DIAGNOSIS — N400 Enlarged prostate without lower urinary tract symptoms: Secondary | ICD-10-CM | POA: Diagnosis not present

## 2022-05-01 DIAGNOSIS — N5201 Erectile dysfunction due to arterial insufficiency: Secondary | ICD-10-CM | POA: Diagnosis not present

## 2022-05-01 DIAGNOSIS — R972 Elevated prostate specific antigen [PSA]: Secondary | ICD-10-CM | POA: Diagnosis not present

## 2022-05-02 NOTE — Telephone Encounter (Signed)
I have not seen this in folder

## 2022-05-02 NOTE — Telephone Encounter (Signed)
Ok thanks 

## 2022-05-19 ENCOUNTER — Telehealth: Payer: Self-pay | Admitting: *Deleted

## 2022-05-19 ENCOUNTER — Other Ambulatory Visit: Payer: Self-pay | Admitting: Podiatry

## 2022-05-19 MED ORDER — NITROGLYCERIN 0.2 MG/HR TD PT24: 0.2000 mg | MEDICATED_PATCH | Freq: Every day | TRANSDERMAL | 12 refills | Status: AC

## 2022-05-19 NOTE — Telephone Encounter (Signed)
Patient's wife is calling for a refill of the nitroglycerin 0.2 mg /hr patch. He is almost out and still in pain. Please advise.

## 2022-05-19 NOTE — Telephone Encounter (Signed)
Patient notified

## 2022-05-19 NOTE — Telephone Encounter (Signed)
I sent in refill. Come in if not improving

## 2022-05-22 DIAGNOSIS — Z0000 Encounter for general adult medical examination without abnormal findings: Secondary | ICD-10-CM | POA: Diagnosis not present

## 2022-05-22 DIAGNOSIS — I10 Essential (primary) hypertension: Secondary | ICD-10-CM | POA: Diagnosis not present

## 2022-05-22 DIAGNOSIS — R7303 Prediabetes: Secondary | ICD-10-CM | POA: Diagnosis not present

## 2022-05-22 DIAGNOSIS — E782 Mixed hyperlipidemia: Secondary | ICD-10-CM | POA: Diagnosis not present

## 2022-05-22 DIAGNOSIS — N182 Chronic kidney disease, stage 2 (mild): Secondary | ICD-10-CM | POA: Diagnosis not present

## 2022-05-27 DIAGNOSIS — H52223 Regular astigmatism, bilateral: Secondary | ICD-10-CM | POA: Diagnosis not present

## 2022-05-29 DIAGNOSIS — R7303 Prediabetes: Secondary | ICD-10-CM | POA: Diagnosis not present

## 2022-05-29 DIAGNOSIS — E782 Mixed hyperlipidemia: Secondary | ICD-10-CM | POA: Diagnosis not present

## 2022-05-29 DIAGNOSIS — I10 Essential (primary) hypertension: Secondary | ICD-10-CM | POA: Diagnosis not present

## 2022-05-29 DIAGNOSIS — N182 Chronic kidney disease, stage 2 (mild): Secondary | ICD-10-CM | POA: Diagnosis not present

## 2022-05-29 DIAGNOSIS — Z0000 Encounter for general adult medical examination without abnormal findings: Secondary | ICD-10-CM | POA: Diagnosis not present

## 2022-05-29 DIAGNOSIS — Z23 Encounter for immunization: Secondary | ICD-10-CM | POA: Diagnosis not present

## 2022-06-05 DIAGNOSIS — Z23 Encounter for immunization: Secondary | ICD-10-CM | POA: Diagnosis not present

## 2022-06-09 ENCOUNTER — Telehealth: Payer: Self-pay | Admitting: *Deleted

## 2022-06-09 NOTE — Telephone Encounter (Signed)
Number [phone] apply for 12 hours twice a day

## 2022-06-09 NOTE — Telephone Encounter (Signed)
[facility] is calling for clarification on nitroglycerin 0.2 mg patches prescription sent in 06/05/22,will need the quantity,days supply and directions.

## 2022-06-10 NOTE — Telephone Encounter (Signed)
Called [facility],order has been shipped to patient one day ago, duplicate of previous nitroglycerin patches prescribed which is 90 day,taking once daily.

## 2023-04-24 DIAGNOSIS — Z23 Encounter for immunization: Secondary | ICD-10-CM | POA: Diagnosis not present

## 2023-05-12 DIAGNOSIS — Z23 Encounter for immunization: Secondary | ICD-10-CM | POA: Diagnosis not present

## 2023-06-09 DIAGNOSIS — Z125 Encounter for screening for malignant neoplasm of prostate: Secondary | ICD-10-CM | POA: Diagnosis not present

## 2023-06-09 DIAGNOSIS — R7303 Prediabetes: Secondary | ICD-10-CM | POA: Diagnosis not present

## 2023-06-09 DIAGNOSIS — E782 Mixed hyperlipidemia: Secondary | ICD-10-CM | POA: Diagnosis not present

## 2023-06-09 DIAGNOSIS — R5383 Other fatigue: Secondary | ICD-10-CM | POA: Diagnosis not present

## 2023-06-09 DIAGNOSIS — Z0000 Encounter for general adult medical examination without abnormal findings: Secondary | ICD-10-CM | POA: Diagnosis not present

## 2023-06-09 DIAGNOSIS — I10 Essential (primary) hypertension: Secondary | ICD-10-CM | POA: Diagnosis not present

## 2023-06-09 DIAGNOSIS — N182 Chronic kidney disease, stage 2 (mild): Secondary | ICD-10-CM | POA: Diagnosis not present

## 2023-06-16 DIAGNOSIS — R7303 Prediabetes: Secondary | ICD-10-CM | POA: Diagnosis not present

## 2023-06-16 DIAGNOSIS — I10 Essential (primary) hypertension: Secondary | ICD-10-CM | POA: Diagnosis not present

## 2023-06-16 DIAGNOSIS — Z0000 Encounter for general adult medical examination without abnormal findings: Secondary | ICD-10-CM | POA: Diagnosis not present

## 2023-06-16 DIAGNOSIS — N182 Chronic kidney disease, stage 2 (mild): Secondary | ICD-10-CM | POA: Diagnosis not present

## 2023-06-16 DIAGNOSIS — E782 Mixed hyperlipidemia: Secondary | ICD-10-CM | POA: Diagnosis not present

## 2023-07-14 DIAGNOSIS — N400 Enlarged prostate without lower urinary tract symptoms: Secondary | ICD-10-CM | POA: Diagnosis not present

## 2023-07-23 DIAGNOSIS — R972 Elevated prostate specific antigen [PSA]: Secondary | ICD-10-CM | POA: Diagnosis not present

## 2023-07-23 DIAGNOSIS — N400 Enlarged prostate without lower urinary tract symptoms: Secondary | ICD-10-CM | POA: Diagnosis not present

## 2023-12-01 DIAGNOSIS — I10 Essential (primary) hypertension: Secondary | ICD-10-CM | POA: Diagnosis not present

## 2023-12-01 DIAGNOSIS — N182 Chronic kidney disease, stage 2 (mild): Secondary | ICD-10-CM | POA: Diagnosis not present

## 2023-12-01 DIAGNOSIS — E782 Mixed hyperlipidemia: Secondary | ICD-10-CM | POA: Diagnosis not present

## 2023-12-01 DIAGNOSIS — R7303 Prediabetes: Secondary | ICD-10-CM | POA: Diagnosis not present

## 2023-12-08 DIAGNOSIS — E782 Mixed hyperlipidemia: Secondary | ICD-10-CM | POA: Diagnosis not present

## 2023-12-08 DIAGNOSIS — I10 Essential (primary) hypertension: Secondary | ICD-10-CM | POA: Diagnosis not present

## 2023-12-08 DIAGNOSIS — R7303 Prediabetes: Secondary | ICD-10-CM | POA: Diagnosis not present

## 2023-12-08 DIAGNOSIS — N182 Chronic kidney disease, stage 2 (mild): Secondary | ICD-10-CM | POA: Diagnosis not present

## 2024-02-25 DIAGNOSIS — D492 Neoplasm of unspecified behavior of bone, soft tissue, and skin: Secondary | ICD-10-CM | POA: Diagnosis not present

## 2024-02-25 DIAGNOSIS — L821 Other seborrheic keratosis: Secondary | ICD-10-CM | POA: Diagnosis not present

## 2024-04-21 DIAGNOSIS — H6992 Unspecified Eustachian tube disorder, left ear: Secondary | ICD-10-CM | POA: Diagnosis not present

## 2024-04-29 DIAGNOSIS — Z23 Encounter for immunization: Secondary | ICD-10-CM | POA: Diagnosis not present

## 2024-06-14 DIAGNOSIS — E782 Mixed hyperlipidemia: Secondary | ICD-10-CM | POA: Diagnosis not present

## 2024-06-14 DIAGNOSIS — R7303 Prediabetes: Secondary | ICD-10-CM | POA: Diagnosis not present

## 2024-06-14 DIAGNOSIS — I10 Essential (primary) hypertension: Secondary | ICD-10-CM | POA: Diagnosis not present

## 2024-06-14 DIAGNOSIS — Z125 Encounter for screening for malignant neoplasm of prostate: Secondary | ICD-10-CM | POA: Diagnosis not present

## 2024-06-14 DIAGNOSIS — N182 Chronic kidney disease, stage 2 (mild): Secondary | ICD-10-CM | POA: Diagnosis not present

## 2024-07-05 DIAGNOSIS — Z23 Encounter for immunization: Secondary | ICD-10-CM | POA: Diagnosis not present

## 2024-07-21 DIAGNOSIS — R972 Elevated prostate specific antigen [PSA]: Secondary | ICD-10-CM | POA: Diagnosis not present
